# Patient Record
Sex: Female | Born: 1980 | Race: White | Hispanic: No | Marital: Single | Smoking: Current every day smoker
Health system: Southern US, Community
[De-identification: ages and names within clinical notes are randomized; demographics above are authoritative.]

## PROBLEM LIST (undated history)

## (undated) DIAGNOSIS — F329 Major depressive disorder, single episode, unspecified: Secondary | ICD-10-CM

## (undated) DIAGNOSIS — F32A Depression, unspecified: Secondary | ICD-10-CM

## (undated) DIAGNOSIS — N200 Calculus of kidney: Secondary | ICD-10-CM

## (undated) HISTORY — DX: Major depressive disorder, single episode, unspecified: F32.9

## (undated) HISTORY — DX: Depression, unspecified: F32.A

## (undated) HISTORY — DX: Calculus of kidney: N20.0

---

## 2005-08-07 ENCOUNTER — Ambulatory Visit: Payer: Self-pay

## 2008-02-24 ENCOUNTER — Emergency Department: Payer: Self-pay | Admitting: Internal Medicine

## 2008-04-06 ENCOUNTER — Emergency Department: Payer: Self-pay | Admitting: Emergency Medicine

## 2008-12-02 ENCOUNTER — Emergency Department: Payer: Self-pay

## 2008-12-22 ENCOUNTER — Emergency Department: Payer: Self-pay | Admitting: Emergency Medicine

## 2009-03-13 ENCOUNTER — Emergency Department: Payer: Self-pay | Admitting: Emergency Medicine

## 2009-03-23 ENCOUNTER — Emergency Department: Payer: Self-pay | Admitting: Emergency Medicine

## 2010-12-17 ENCOUNTER — Emergency Department: Payer: Self-pay | Admitting: Unknown Physician Specialty

## 2012-12-20 ENCOUNTER — Emergency Department: Payer: Self-pay | Admitting: Emergency Medicine

## 2013-03-12 ENCOUNTER — Emergency Department: Payer: Self-pay | Admitting: Emergency Medicine

## 2013-08-11 ENCOUNTER — Emergency Department: Payer: Self-pay | Admitting: Internal Medicine

## 2013-08-11 LAB — URINALYSIS, COMPLETE
Bilirubin,UR: NEGATIVE
Blood: NEGATIVE
Glucose,UR: NEGATIVE mg/dL (ref 0–75)
Nitrite: NEGATIVE
Ph: 5 (ref 4.5–8.0)
SPECIFIC GRAVITY: 1.031 (ref 1.003–1.030)
WBC UR: 39 /HPF (ref 0–5)

## 2013-12-07 ENCOUNTER — Emergency Department: Payer: Self-pay | Admitting: Emergency Medicine

## 2014-08-12 ENCOUNTER — Emergency Department
Admission: EM | Admit: 2014-08-12 | Discharge: 2014-08-12 | Disposition: A | Discharge status: Home / Self Care | Payer: BLUE CROSS/BLUE SHIELD | Attending: Emergency Medicine | Admitting: Emergency Medicine

## 2014-08-12 ENCOUNTER — Encounter: Payer: Self-pay | Admitting: Emergency Medicine

## 2014-08-12 DIAGNOSIS — Y998 Other external cause status: Secondary | ICD-10-CM | POA: Diagnosis not present

## 2014-08-12 DIAGNOSIS — Y9389 Activity, other specified: Secondary | ICD-10-CM | POA: Diagnosis not present

## 2014-08-12 DIAGNOSIS — Y9289 Other specified places as the place of occurrence of the external cause: Secondary | ICD-10-CM | POA: Diagnosis not present

## 2014-08-12 DIAGNOSIS — S41152A Open bite of left upper arm, initial encounter: Secondary | ICD-10-CM | POA: Insufficient documentation

## 2014-08-12 DIAGNOSIS — Z720 Tobacco use: Secondary | ICD-10-CM | POA: Insufficient documentation

## 2014-08-12 DIAGNOSIS — W540XXA Bitten by dog, initial encounter: Secondary | ICD-10-CM | POA: Insufficient documentation

## 2014-08-12 MED ORDER — AMOXICILLIN-POT CLAVULANATE 875-125 MG PO TABS: 1.0000 | ORAL_TABLET | Freq: Two times a day (BID) | ORAL | Status: DC

## 2014-08-12 MED ORDER — TETANUS-DIPHTH-ACELL PERTUSSIS 5-2.5-18.5 LF-MCG/0.5 IM SUSP: 0.5000 mL | Freq: Once | INTRAMUSCULAR | Status: DC

## 2014-08-12 MED ORDER — KETOROLAC TROMETHAMINE 10 MG PO TABS: 10.0000 mg | ORAL_TABLET | Freq: Three times a day (TID) | ORAL | Status: DC

## 2014-08-12 MED ORDER — TRAMADOL HCL 50 MG PO TABS: 50.0000 mg | ORAL_TABLET | Freq: Two times a day (BID) | ORAL | Status: DC

## 2014-08-12 NOTE — ED Provider Notes (Signed)
[facility] Emergency Department Provider Note ____________________________________________  Time seen: 39  I have reviewed the triage vital signs and the nursing notes.  HISTORY  Chief Complaint Animal Bite  HPI Margaret Gonzales is a 34 y.o. female who reports to the ED for evaluation and management of a injury sustained to her left arm, after her top of bit her today. She describes that her female pitbull was in heat, and got into an altercation in the bed with her other pitbull. She tried to break up the dog fight, and inadvertently got bitten on the left upper arm. She notes the female pit, that bit her has been in her care for the last year, given to her by her boyfriend.  She is unsure of that dog's vaccine status prior to her taking custody. She notes all of her dogs are healthy, well-kept, and indoor animals.  She notes the dog has not been aggressive, ill, or otherwise had a change in behavior. She initially had the wound cleaned and dressed by her friend who is a Public relations account executive. She reports here for evaluation management of her dog bite. She reports a tetanus status is up-to-date within 6 weeks due to a recent finger laceration.  History reviewed. No pertinent past medical history.  There are no active problems to display for this patient.  History reviewed. No pertinent past surgical history.  Current Outpatient Rx  Name  Route  Sig  Dispense  Refill  . amoxicillin-clavulanate (AUGMENTIN) 875-125 MG per tablet   Oral   Take 1 tablet by mouth 2 (two) times daily.   20 tablet   0   . ketorolac (TORADOL) 10 MG tablet   Oral   Take 1 tablet (10 mg total) by mouth every 8 (eight) hours.   15 tablet   0   . traMADol (ULTRAM) 50 MG tablet   Oral   Take 1 tablet (50 mg total) by mouth 2 (two) times daily.   10 tablet   0    Allergies Review of patient's allergies indicates no known allergies.  History reviewed. No pertinent family  history.  Social History History  Substance Use Topics  . Smoking status: Current Every Day Smoker -- 1.00 packs/day    Types: Cigarettes  . Smokeless tobacco: Not on file  . Alcohol Use: No   Review of Systems  Constitutional: Negative for fever. Eyes: Negative for visual changes. ENT: Negative for sore throat. Cardiovascular: Negative for chest pain. Respiratory: Negative for shortness of breath. Gastrointestinal: Negative for abdominal pain, vomiting and diarrhea. Genitourinary: Negative for dysuria. Musculoskeletal: Negative for back pain. Skin: Negative for rash. Dog bite to upper left arm. Neurological: Negative for headaches, focal weakness or numbness. ____________________________________________  PHYSICAL EXAM:  VITAL SIGNS: ED Triage Vitals  Enc Vitals Group     BP --      Pulse Rate 08/12/14 0826 83     Resp --      Temp 08/12/14 0826 98 F (36.7 C)     Temp Source 08/12/14 0826 Oral     SpO2 08/12/14 0826 98 %     Weight 08/12/14 0826 150 lb (68.04 kg)     Height 08/12/14 0826 5\' 7"  (1.702 m)     Head Cir --      Peak Flow --      Pain Score 08/12/14 0827 6     Pain Loc --      Pain Edu? --  Excl. in GC? --    Constitutional: Alert and oriented. Well appearing and in no distress. HEENT:   Normocephalic and atraumatic. Conjunctivae are normal. PERRL. Normal extraocular movements. Hematological/Lymphatic/Immunilogical: No cervical lymphadenopathy. Cardiovascular: Normal distal pulses. Respiratory: Normal respiratory effort. Musculoskeletal: Nontender with normal range of motion in all extremities. No lower extremity tenderness nor edema. Neurologic:  Normal speech and language. No gross focal neurologic deficits are appreciated. Skin:  Skin is warm, dry and intact, except for multiple lacerations. Bruising noted to the left biceps consistent with injuries. Left upper arm with a 1.5 cm superficial laceration exposing underlying fat over the biceps. Two  other 0.5 cm punctures noted distally.  A single linear scratch measuring about 4 cm in noted laterally.  Psychiatric: Mood and affect are normal. Patient exhibits appropriate insight and judgment. ____________________________________________  PROCEDURES  Wound clean and repaired with Steri-strips by this PA-C. ____________________________________________  INITIAL IMPRESSION / ASSESSMENT AND PLAN / ED COURSE Provoked dog bite from pet during agitated state when fighting with other dog. Patient reports dog that bite is likely from the smaller, unvaccinated female dog. Discussed rabies vaccine administration, but noted it was not emergently needed in this case. Return as discussed for rabies series, if needed, in 10-12 days. Will follow-up with veterinarian to update rabies vaccine and discuss quarantine process. Patient advised on the low likelihood of rabies in healthy, indoor, personal pet.  ____________________________________________  FINAL CLINICAL IMPRESSION(S) / ED DIAGNOSES  Final diagnoses:  Dog bite of arm, left, initial encounter     Lissa Hoard, PA-C 08/12/14 9604  Sharyn Creamer, MD 08/12/14 1551

## 2014-08-12 NOTE — ED Notes (Signed)
Pt has dog bite-pit bull to left upper arm, pt states a fireman friend cleaned it, arm wrapped in gauze upon arrival. The dog belonged to pt, states she thinks all of the shots were up to date. Bleeding controlled at this time.

## 2014-08-12 NOTE — Discharge Instructions (Signed)
Laceration Care, Adult A laceration is a cut that goes through all layers of the skin. The cut goes into the tissue beneath the skin. HOME CARE For stitches (sutures) or staples:  Keep the cut clean and dry.  If you have a bandage (dressing), change it at least once a day. Change the bandage if it gets wet or dirty, or as told by your doctor.  Wash the cut with soap and water 2 times a day. Rinse the cut with water. Pat it dry with a clean towel.  Put a thin layer of medicated cream on the cut as told by your doctor.  You may shower after the first 24 hours. Do not soak the cut in water until the stitches are removed.  Only take medicines as told by your doctor.  Have your stitches or staples removed as told by your doctor. For skin adhesive strips:  Keep the cut clean and dry.  Do not get the strips wet. You may take a bath, but be careful to keep the cut dry.  If the cut gets wet, pat it dry with a clean towel.  The strips will fall off on their own. Do not remove the strips that are still stuck to the cut. For wound glue:  You may shower or take baths. Do not soak or scrub the cut. Do not swim. Avoid heavy sweating until the glue falls off on its own. After a shower or bath, pat the cut dry with a clean towel.  Do not put medicine on your cut until the glue falls off.  If you have a bandage, do not put tape over the glue.  Avoid lots of sunlight or tanning lamps until the glue falls off. Put sunscreen on the cut for the first year to reduce your scar.  The glue will fall off on its own. Do not pick at the glue. You may need a tetanus shot if:  You cannot remember when you had your last tetanus shot.  You have never had a tetanus shot. If you need a tetanus shot and you choose not to have one, you may get tetanus. Sickness from tetanus can be serious. GET HELP RIGHT AWAY IF:   Your pain does not get better with medicine.  Your arm, hand, leg, or foot loses feeling  (numbness) or changes color.  Your cut is bleeding.  Your joint feels weak, or you cannot use your joint.  You have painful lumps on your body.  Your cut is red, puffy (swollen), or painful.  You have a red line on the skin near the cut.  You have yellowish-white fluid (pus) coming from the cut.  You have a fever.  You have a bad smell coming from the cut or bandage.  Your cut breaks open before or after stitches are removed.  You notice something coming out of the cut, such as wood or glass.  You cannot move a finger or toe. MAKE SURE YOU:   Understand these instructions.  Will watch your condition.  Will get help right away if you are not doing well or get worse. Document Released: 08/01/2007 Document Revised: 05/07/2011 Document Reviewed: 08/08/2010 [facility] Patient Information 2015 [place], [place]. This information is not intended to replace advice given to you by your health care provider. Make sure you discuss any questions you have with your health care provider.  Animal Bite Animal bite wounds can get infected. It is important to get proper medical treatment. Ask your  doctor if you need a rabies shot. HOME CARE   Follow your doctor's instructions for taking care of your wound.  Only take medicine as told by your doctor.  Take your medicine (antibiotics) as told. Finish them even if you start to feel better.  Keep all doctor visits as told. You may need a tetanus shot if:   You cannot remember when you had your last tetanus shot.  You have never had a tetanus shot.  The injury broke your skin. If you need a tetanus shot and you choose not to have one, you may get tetanus. Sickness from tetanus can be serious. GET HELP RIGHT AWAY IF:   Your wound is warm, red, sore, or puffy (swollen).  You notice yellowish-white fluid (pus) or a bad smell coming from the wound.  You see a red line on the skin coming from the wound.  You have a fever, chills, or you  feel sick.  You feel sick to your stomach (nauseous), or you throw up (vomit).  Your pain does not go away, or it gets worse.  You have trouble moving the injured part.  You have questions or concerns. MAKE SURE YOU:   Understand these instructions.  Will watch your condition.  Will get help right away if you are not doing well or get worse. Document Released: 02/12/2005 Document Revised: 05/07/2011 Document Reviewed: 10/04/2010 [facility] Patient Information 2015 [place], [place]. This information is not intended to replace advice given to you by your health care provider. Make sure you discuss any questions you have with your health care provider.  Keep the wound clean, dry, and covered.  Return as needed for wound checks.  Take the antibiotic as directed for infection control.  Apply ice to reduce swelling.

## 2014-08-12 NOTE — ED Notes (Signed)
Steri strips applied to left arm

## 2015-02-15 ENCOUNTER — Ambulatory Visit (INDEPENDENT_AMBULATORY_CARE_PROVIDER_SITE_OTHER): Payer: BLUE CROSS/BLUE SHIELD | Admitting: Family Medicine

## 2015-02-15 ENCOUNTER — Encounter: Payer: Self-pay | Admitting: Family Medicine

## 2015-02-15 VITALS — BP 103/67 | HR 80 | Temp 98.1°F | Resp 16 | Ht 67.0 in | Wt 156.0 lb

## 2015-02-15 DIAGNOSIS — M542 Cervicalgia: Secondary | ICD-10-CM

## 2015-02-15 DIAGNOSIS — Z7189 Other specified counseling: Secondary | ICD-10-CM

## 2015-02-15 DIAGNOSIS — Z720 Tobacco use: Secondary | ICD-10-CM

## 2015-02-15 DIAGNOSIS — F17200 Nicotine dependence, unspecified, uncomplicated: Secondary | ICD-10-CM | POA: Insufficient documentation

## 2015-02-15 DIAGNOSIS — G43909 Migraine, unspecified, not intractable, without status migrainosus: Secondary | ICD-10-CM

## 2015-02-15 DIAGNOSIS — Z7689 Persons encountering health services in other specified circumstances: Secondary | ICD-10-CM | POA: Insufficient documentation

## 2015-02-15 MED ORDER — MELOXICAM 15 MG PO TABS: 15.0000 mg | ORAL_TABLET | Freq: Every day | ORAL | Status: DC

## 2015-02-15 MED ORDER — METAXALONE 800 MG PO TABS: 800.0000 mg | ORAL_TABLET | Freq: Three times a day (TID) | ORAL | Status: DC

## 2015-02-15 NOTE — Patient Instructions (Signed)
Keep headache diary  Discussed stopping smoking

## 2015-02-15 NOTE — Progress Notes (Signed)
Name: Margaret Gonzales   MRN: [id number]    DOB: 11/01/1980   Date:02/15/2015       Progress Note  Subjective  Chief Complaint  Chief Complaint  Patient presents with  . Establish Care    HPI Here to establish care.  She is a smoker.  Smokes 1.5 ppd.  She would like to stop.    She has migraine headaches fairly frequently. She takes multiple medications.  She has never kept a headache diary.  HAs are associated with nausea, photo and phono phobia.  HA is usually throbbing and R frontal.  No aura.  Has R neck and arm pain down to 4th and 5th fingers.  Daily.  No problem-specific assessment & plan notes found for this encounter.   Past Medical History  Diagnosis Date  . Depression   . Kidney stones     History reviewed. No pertinent past surgical history.  Family History  Problem Relation Age of Onset  . Diabetes Mother     Social History   Social History  . Marital Status: Single    Spouse Name: N/A  . Number of Children: N/A  . Years of Education: N/A   Occupational History  . Not on file.   Social History Main Topics  . Smoking status: Current Every Day Smoker -- 1.50 packs/day    Types: Cigarettes  . Smokeless tobacco: Never Used  . Alcohol Use: No  . Drug Use: Yes    Special: Marijuana  . Sexual Activity: Not on file   Other Topics Concern  . Not on file   Social History Narrative     Current outpatient prescriptions:  .  amoxicillin-clavulanate (AUGMENTIN) 875-125 MG tablet, , Disp: , Rfl: 0 .  methylPREDNISolone (MEDROL DOSEPAK) 4 MG TBPK tablet, , Disp: , Rfl: 0 .  meloxicam (MOBIC) 15 MG tablet, Take 1 tablet (15 mg total) by mouth daily., Disp: 30 tablet, Rfl: 3 .  metaxalone (SKELAXIN) 800 MG tablet, Take 1 tablet (800 mg total) by mouth 3 (three) times daily., Disp: 30 tablet, Rfl: 3  Not on File   Review of Systems  Constitutional: Negative for fever, chills, weight loss and malaise/fatigue.  HENT: Negative for hearing loss.   Eyes:  Negative for blurred vision and double vision.  Respiratory: Positive for shortness of breath (in cold air). Negative for cough and wheezing.   Cardiovascular: Negative for chest pain, palpitations and leg swelling.  Gastrointestinal: Negative for heartburn, abdominal pain and constipation.  Genitourinary: Negative for dysuria, urgency and frequency.  Musculoskeletal: Positive for neck pain (R neck and arm and into R hand).  Skin: Negative for rash.  Neurological: Negative for weakness and headaches.      Objective  Filed Vitals:   02/15/15 1044  BP: 103/67  Pulse: 80  Temp: 98.1 F (36.7 C)  Resp: 16  Height: 5\' 7"  (1.702 m)  Weight: 156 lb (70.761 kg)  SpO2: 98%    Physical Exam  Constitutional: She is oriented to person, place, and time and well-developed, well-nourished, and in no distress. No distress.  HENT:  Head: Normocephalic and atraumatic.  Right Ear: External ear normal.  Left Ear: External ear normal.  Nose: Nose normal.  Mouth/Throat: Oropharynx is clear and moist.  Eyes: Conjunctivae and EOM are normal. Pupils are equal, round, and reactive to light. No scleral icterus.  Neck: Normal range of motion. Neck supple. Carotid bruit is not present. No thyromegaly present.  Cardiovascular: Normal rate, regular rhythm and  normal heart sounds.  Exam reveals no gallop and no friction rub.   No murmur heard. Pulmonary/Chest: Effort normal and breath sounds normal. No respiratory distress. She has no wheezes. She has no rales.  Abdominal: Soft. Bowel sounds are normal. She exhibits no distension and no mass. There is no tenderness.  Musculoskeletal: She exhibits no edema.  Lymphadenopathy:    She has no cervical adenopathy.  Neurological: She is alert and oriented to person, place, and time.  Pain from R neck trapezius into R shoulder and down R arn to R 4th and 5th fingers.  Vitals reviewed.      No results found for this or any previous visit (from the past  2160 hour(s)).   Assessment & Plan  Problem List Items Addressed This Visit      Cardiovascular and Mediastinum   Migraine (Chronic)   Relevant Medications   meloxicam (MOBIC) 15 MG tablet   metaxalone (SKELAXIN) 800 MG tablet     Other   Encounter to establish care - Primary   Smoker   Neck pain on right side   Relevant Medications   meloxicam (MOBIC) 15 MG tablet   metaxalone (SKELAXIN) 800 MG tablet      Meds ordered this encounter  Medications  . DISCONTD: AFLURIA PRESERVATIVE FREE 0.5 ML SUSY    Sig: inject 0.5 milliliter intramuscularly    Refill:  0  . methylPREDNISolone (MEDROL DOSEPAK) 4 MG TBPK tablet    Sig:     Refill:  0  . amoxicillin-clavulanate (AUGMENTIN) 875-125 MG tablet    Sig:     Refill:  0  . meloxicam (MOBIC) 15 MG tablet    Sig: Take 1 tablet (15 mg total) by mouth daily.    Dispense:  30 tablet    Refill:  3  . metaxalone (SKELAXIN) 800 MG tablet    Sig: Take 1 tablet (800 mg total) by mouth 3 (three) times daily.    Dispense:  30 tablet    Refill:  3   1. Encounter to establish care   2. Migraine without status migrainosus, not intractable, unspecified migraine type -keep headache diary  3. Smoker -discussed stopping smoking  4. Neck pain on right side  - meloxicam (MOBIC) 15 MG tablet; Take 1 tablet (15 mg total) by mouth daily.  Dispense: 30 tablet; Refill: 3 - metaxalone (SKELAXIN) 800 MG tablet; Take 1 tablet (800 mg total) by mouth 3 (three) times daily.  Dispense: 30 tablet; Refill: 3

## 2015-03-29 ENCOUNTER — Encounter: Payer: Self-pay | Admitting: Family Medicine

## 2015-03-29 ENCOUNTER — Ambulatory Visit (INDEPENDENT_AMBULATORY_CARE_PROVIDER_SITE_OTHER): Payer: BLUE CROSS/BLUE SHIELD | Admitting: Family Medicine

## 2015-03-29 VITALS — BP 119/72 | HR 68 | Temp 98.0°F | Resp 16 | Ht 67.0 in | Wt 148.0 lb

## 2015-03-29 DIAGNOSIS — F329 Major depressive disorder, single episode, unspecified: Secondary | ICD-10-CM | POA: Diagnosis not present

## 2015-03-29 DIAGNOSIS — F419 Anxiety disorder, unspecified: Secondary | ICD-10-CM | POA: Insufficient documentation

## 2015-03-29 DIAGNOSIS — F411 Generalized anxiety disorder: Secondary | ICD-10-CM | POA: Diagnosis not present

## 2015-03-29 DIAGNOSIS — G43909 Migraine, unspecified, not intractable, without status migrainosus: Secondary | ICD-10-CM | POA: Diagnosis not present

## 2015-03-29 DIAGNOSIS — Z720 Tobacco use: Secondary | ICD-10-CM | POA: Diagnosis not present

## 2015-03-29 DIAGNOSIS — F17200 Nicotine dependence, unspecified, uncomplicated: Secondary | ICD-10-CM

## 2015-03-29 DIAGNOSIS — F32A Depression, unspecified: Secondary | ICD-10-CM | POA: Insufficient documentation

## 2015-03-29 MED ORDER — ESCITALOPRAM OXALATE 10 MG PO TABS: 10.0000 mg | ORAL_TABLET | Freq: Every day | ORAL | Status: DC

## 2015-03-29 NOTE — Progress Notes (Signed)
Name: Margaret Gonzales   MRN: [id number]    DOB: April 12, 1980   Date:03/29/2015       Progress Note  Subjective  Chief Complaint  Chief Complaint  Patient presents with  . Migraine    HPI Here for f/u of Migraine HAs.  Her HAs have improved greatly since she got glasses.  Also doing well with Mobic, but wonders if she needs it regularly.  C/o lack of motivation, poor sleep (awakening), poor energy, appetite is poor  Drinks a lot of coffee.  Feels anxious all the time.  Worries about lots of things that are not important.  Cries easily.  Father had bipolar disorder.  She has never been on meds for depression.   No problem-specific assessment & plan notes found for this encounter.   Past Medical History  Diagnosis Date  . Depression   . Kidney stones     History reviewed. No pertinent past surgical history.  Family History  Problem Relation Age of Onset  . Diabetes Mother     Social History   Social History  . Marital Status: Single    Spouse Name: N/A  . Number of Children: N/A  . Years of Education: N/A   Occupational History  . Not on file.   Social History Main Topics  . Smoking status: Current Every Day Smoker -- 1.50 packs/day    Types: Cigarettes  . Smokeless tobacco: Never Used  . Alcohol Use: No  . Drug Use: Yes    Special: Marijuana  . Sexual Activity: Not on file   Other Topics Concern  . Not on file   Social History Narrative     Current outpatient prescriptions:  .  meloxicam (MOBIC) 15 MG tablet, Take 1 tablet (15 mg total) by mouth daily., Disp: 30 tablet, Rfl: 3 .  metaxalone (SKELAXIN) 800 MG tablet, Take 1 tablet (800 mg total) by mouth 3 (three) times daily., Disp: 30 tablet, Rfl: 3 .  escitalopram (LEXAPRO) 10 MG tablet, Take 1 tablet (10 mg total) by mouth daily., Disp: 30 tablet, Rfl: 3  Not on File   Review of Systems  Constitutional: Negative for fever, chills, weight loss and malaise/fatigue.  HENT: Negative for hearing loss.    Eyes: Negative for blurred vision and double vision.  Respiratory: Negative for cough, shortness of breath and wheezing.   Cardiovascular: Negative for chest pain, palpitations and leg swelling.  Gastrointestinal: Negative for heartburn, abdominal pain and blood in stool.  Genitourinary: Negative for dysuria, urgency and frequency.  Musculoskeletal: Negative for myalgias.  Skin: Negative for rash.  Neurological: Positive for headaches. Negative for dizziness, tremors and weakness.  Psychiatric/Behavioral: Positive for depression. The patient is nervous/anxious and has insomnia.       Objective  Filed Vitals:   03/29/15 1034  BP: 119/72  Pulse: 68  Temp: 98 F (36.7 C)  TempSrc: Oral  Resp: 16  Height:  (1.702 m)  Weight: 148 lb (67.132 kg)    Physical Exam  Constitutional: She is oriented to person, place, and time and well-developed, well-nourished, and in no distress. No distress.  HENT:  Head: Normocephalic and atraumatic.  Eyes: Conjunctivae and EOM are normal. Pupils are equal, round, and reactive to light.  Neck: Normal range of motion. Neck supple. Carotid bruit is not present. No thyromegaly present.  Cardiovascular: Normal rate, regular rhythm and normal heart sounds.  Exam reveals no gallop and no friction rub.   No murmur heard. Pulmonary/Chest: Effort normal and  breath sounds normal. No respiratory distress. She has no wheezes. She has no rales.  Abdominal: Soft. Bowel sounds are normal. She exhibits no distension, no abdominal bruit and no mass. There is no tenderness.  Musculoskeletal: She exhibits no edema.  Lymphadenopathy:    She has no cervical adenopathy.  Neurological: She is alert and oriented to person, place, and time.  Psychiatric:  Affec is anxious and depressed.  PHQ-9 score of 17.  GAD 7 score of 16  Vitals reviewed.      No results found for this or any previous visit (from the past 2160 hour(s)).   Assessment & Plan  Problem  List Items Addressed This Visit      Cardiovascular and Mediastinum   Migraine - Primary (Chronic)   Relevant Medications   escitalopram (LEXAPRO) 10 MG tablet     Other   Smoker   Relevant Orders   Nurse to provide smoking / tobacco cessation education   Depression   Relevant Medications   escitalopram (LEXAPRO) 10 MG tablet   Other Relevant Orders   Comprehensive Metabolic Panel (CMET)   CBC with Differential   TSH   Anxiety disorder   Relevant Medications   escitalopram (LEXAPRO) 10 MG tablet      Meds ordered this encounter  Medications  . escitalopram (LEXAPRO) 10 MG tablet    Sig: Take 1 tablet (10 mg total) by mouth daily.    Dispense:  30 tablet    Refill:  3    1. Depression  - Comprehensive Metabolic Panel (CMET) - CBC with Differential - TSH - escitalopram (LEXAPRO) 10 MG tablet; Take 1 tablet (10 mg total) by mouth daily.  Dispense: 30 tablet; Refill: 3  2. Generalized anxiety disorder  - escitalopram (LEXAPRO) 10 MG tablet; Take 1 tablet (10 mg total) by mouth daily.  Dispense: 30 tablet; Refill: 3  3. Migraine without status migrainosus, not intractable, unspecified migraine type   4. Smoker  - Nurse to provide smoking / tobacco cessation education

## 2015-04-05 LAB — CBC WITH DIFFERENTIAL/PLATELET
BASOS ABS: 0.1 10*3/uL (ref 0.0–0.2)
Basos: 0 %
EOS (ABSOLUTE): 0.4 10*3/uL (ref 0.0–0.4)
EOS: 3 %
HEMATOCRIT: 42.2 % (ref 34.0–46.6)
Hemoglobin: 14.2 g/dL (ref 11.1–15.9)
IMMATURE GRANULOCYTES: 0 %
Immature Grans (Abs): 0 10*3/uL (ref 0.0–0.1)
Lymphocytes Absolute: 2.4 10*3/uL (ref 0.7–3.1)
Lymphs: 18 %
MCH: 29.7 pg (ref 26.6–33.0)
MCHC: 33.6 g/dL (ref 31.5–35.7)
MCV: 88 fL (ref 79–97)
MONOCYTES: 8 %
MONOS ABS: 1 10*3/uL — AB (ref 0.1–0.9)
NEUTROS PCT: 71 %
Neutrophils Absolute: 9.2 10*3/uL — ABNORMAL HIGH (ref 1.4–7.0)
Platelets: 347 10*3/uL (ref 150–379)
RBC: 4.78 x10E6/uL (ref 3.77–5.28)
RDW: 13.4 % (ref 12.3–15.4)
WBC: 13.1 10*3/uL — AB (ref 3.4–10.8)

## 2015-04-05 LAB — COMPREHENSIVE METABOLIC PANEL
ALK PHOS: 60 IU/L (ref 39–117)
ALT: 10 IU/L (ref 0–32)
AST: 14 IU/L (ref 0–40)
Albumin/Globulin Ratio: 1.9 (ref 1.1–2.5)
Albumin: 4.4 g/dL (ref 3.5–5.5)
BUN/Creatinine Ratio: 25 — ABNORMAL HIGH (ref 8–20)
BUN: 15 mg/dL (ref 6–20)
Bilirubin Total: <0.2 mg/dL (ref 0.0–1.2)
CHLORIDE: 104 mmol/L (ref 96–106)
CO2: 19 mmol/L (ref 18–29)
CREATININE: 0.59 mg/dL (ref 0.57–1.00)
Calcium: 9.5 mg/dL (ref 8.7–10.2)
GFR calc Af Amer: 138 mL/min/{1.73_m2} (ref >59)
GFR calc non Af Amer: 120 mL/min/{1.73_m2} (ref >59)
GLUCOSE: 95 mg/dL (ref 65–99)
Globulin, Total: 2.3 g/dL (ref 1.5–4.5)
Potassium: 4.3 mmol/L (ref 3.5–5.2)
SODIUM: 140 mmol/L (ref 134–144)
Total Protein: 6.7 g/dL (ref 6.0–8.5)

## 2015-04-05 LAB — TSH: TSH: 1.51 u[IU]/mL (ref 0.450–4.500)

## 2015-05-09 ENCOUNTER — Ambulatory Visit (INDEPENDENT_AMBULATORY_CARE_PROVIDER_SITE_OTHER): Payer: BLUE CROSS/BLUE SHIELD | Admitting: Family Medicine

## 2015-05-09 ENCOUNTER — Encounter: Payer: Self-pay | Admitting: Family Medicine

## 2015-05-09 VITALS — BP 113/60 | HR 70 | Resp 16 | Ht 67.0 in | Wt 152.4 lb

## 2015-05-09 DIAGNOSIS — F329 Major depressive disorder, single episode, unspecified: Secondary | ICD-10-CM

## 2015-05-09 DIAGNOSIS — G43009 Migraine without aura, not intractable, without status migrainosus: Secondary | ICD-10-CM | POA: Diagnosis not present

## 2015-05-09 DIAGNOSIS — F411 Generalized anxiety disorder: Secondary | ICD-10-CM

## 2015-05-09 DIAGNOSIS — F410 Panic disorder [episodic paroxysmal anxiety] without agoraphobia: Secondary | ICD-10-CM

## 2015-05-09 DIAGNOSIS — F32A Depression, unspecified: Secondary | ICD-10-CM

## 2015-05-09 MED ORDER — ESCITALOPRAM OXALATE 20 MG PO TABS: 20.0000 mg | ORAL_TABLET | Freq: Every day | ORAL | Status: DC

## 2015-05-09 NOTE — Progress Notes (Signed)
Name: Margaret Gonzales   MRN: [id number]    DOB: 12/04/1980   Date:05/09/2015       Progress Note  Subjective  Chief Complaint  Chief Complaint  Patient presents with  . Depression    meds making her less social and she wants to sleep all day and be up all night, Taking med at 5:30 in morning. Patient has diarrhea and constipation now and then since starting meds.   . Headache    had to go to ER and get Toradol shot last week and had headache for 7 days.     HPI  Here for f/u of Depression.  She feels some better, but with poor motivation.  Much less overall depressed.  Appetite is +/-.  Drinks a lot of coffee.    Has HAs that have been on and off for a long time.  She has them often.  To ER last week for HA. No problem-specific assessment & plan notes found for this encounter.   Past Medical History  Diagnosis Date  . Depression   . Kidney stones     History reviewed. No pertinent past surgical history.  Family History  Problem Relation Age of Onset  . Diabetes Mother     Social History   Social History  . Marital Status: Single    Spouse Name: N/A  . Number of Children: N/A  . Years of Education: N/A   Occupational History  . Not on file.   Social History Main Topics  . Smoking status: Current Every Day Smoker -- 1.50 packs/day    Types: Cigarettes  . Smokeless tobacco: Never Used  . Alcohol Use: No  . Drug Use: Yes    Special: Marijuana  . Sexual Activity: Not on file   Other Topics Concern  . Not on file   Social History Narrative     Current outpatient prescriptions:  .  escitalopram (LEXAPRO) 20 MG tablet, Take 1 tablet (20 mg total) by mouth daily., Disp: 30 tablet, Rfl: 4  Not on File   Review of Systems  Constitutional: Negative for fever, chills, weight loss and malaise/fatigue.  HENT: Negative for congestion, ear pain and hearing loss.   Eyes: Negative for blurred vision and double vision.  Respiratory: Negative for cough, shortness of  breath and wheezing.   Cardiovascular: Negative for chest pain, palpitations and leg swelling.  Gastrointestinal: Positive for diarrhea. Negative for heartburn, abdominal pain and blood in stool.  Genitourinary: Negative for dysuria, urgency and frequency.  Musculoskeletal: Negative for myalgias and joint pain.  Skin: Negative for rash.  Neurological: Positive for headaches. Negative for weakness.      Objective  Filed Vitals:   05/09/15 1608  BP: 113/60  Pulse: 70  Resp: 16  Height: 5\' 7"  (1.702 m)  Weight: 152 lb 6.4 oz (69.128 kg)  SpO2: 100%    Physical Exam  Constitutional: She is oriented to person, place, and time and well-developed, well-nourished, and in no distress. No distress.  HENT:  Head: Normocephalic and atraumatic.  Eyes: Conjunctivae are normal. Pupils are equal, round, and reactive to light. No scleral icterus.  Neck: Normal range of motion. Neck supple. Carotid bruit is not present. No thyromegaly present.  Cardiovascular: Normal rate, regular rhythm and normal heart sounds.  Exam reveals no gallop and no friction rub.   No murmur heard. Pulmonary/Chest: Effort normal and breath sounds normal. No respiratory distress. She has no wheezes. She has no rales.  Abdominal: Soft. Bowel  sounds are normal. She exhibits no distension and no mass. There is no tenderness.  Musculoskeletal: She exhibits no edema.  Lymphadenopathy:    She has no cervical adenopathy.  Neurological: She is alert and oriented to person, place, and time.  Vitals reviewed.      Recent Results (from the past 2160 hour(s))  Comprehensive Metabolic Panel (CMET)     Status: Abnormal   Collection Time: 04/04/15  8:57 AM  Result Value Ref Range   Glucose 95 65 - 99 mg/dL   BUN 15 6 - 20 mg/dL   Creatinine, Ser 4.74 0.57 - 1.00 mg/dL   GFR calc non Af Amer 120 >59 mL/min/1.73   GFR calc Af Amer 138 >59 mL/min/1.73   BUN/Creatinine Ratio 25 (H) 8 - 20   Sodium 140 134 - 144 mmol/L    Potassium 4.3 3.5 - 5.2 mmol/L   Chloride 104 96 - 106 mmol/L   CO2 19 18 - 29 mmol/L   Calcium 9.5 8.7 - 10.2 mg/dL   Total Protein 6.7 6.0 - 8.5 g/dL   Albumin 4.4 3.5 - 5.5 g/dL   Globulin, Total 2.3 1.5 - 4.5 g/dL   Albumin/Globulin Ratio 1.9 1.1 - 2.5   Bilirubin Total <0.2 0.0 - 1.2 mg/dL   Alkaline Phosphatase 60 39 - 117 IU/L   AST 14 0 - 40 IU/L   ALT 10 0 - 32 IU/L  CBC with Differential     Status: Abnormal   Collection Time: 04/04/15  8:57 AM  Result Value Ref Range   WBC 13.1 (H) 3.4 - 10.8 x10E3/uL   RBC 4.78 3.77 - 5.28 x10E6/uL   Hemoglobin 14.2 11.1 - 15.9 g/dL   Hematocrit 25.9 56.3 - 46.6 %   MCV 88 79 - 97 fL   MCH 29.7 26.6 - 33.0 pg   MCHC 33.6 31.5 - 35.7 g/dL   RDW 87.5 64.3 - 32.9 %   Platelets 347 150 - 379 x10E3/uL   Neutrophils 71 %   Lymphs 18 %   Monocytes 8 %   Eos 3 %   Basos 0 %   Neutrophils Absolute 9.2 (H) 1.4 - 7.0 x10E3/uL   Lymphocytes Absolute 2.4 0.7 - 3.1 x10E3/uL   Monocytes Absolute 1.0 (H) 0.1 - 0.9 x10E3/uL   EOS (ABSOLUTE) 0.4 0.0 - 0.4 x10E3/uL   Basophils Absolute 0.1 0.0 - 0.2 x10E3/uL   Immature Granulocytes 0 %   Immature Grans (Abs) 0.0 0.0 - 0.1 x10E3/uL  TSH     Status: None   Collection Time: 04/04/15  8:57 AM  Result Value Ref Range   TSH 1.510 0.450 - 4.500 uIU/mL     Assessment & Plan  Problem List Items Addressed This Visit      Cardiovascular and Mediastinum   Migraine (Chronic)   Relevant Medications   escitalopram (LEXAPRO) 20 MG tablet   Other Relevant Orders   Ambulatory referral to Neurology     Other   Depression - Primary   Relevant Medications   escitalopram (LEXAPRO) 20 MG tablet   Anxiety disorder   Relevant Medications   escitalopram (LEXAPRO) 20 MG tablet      Meds ordered this encounter  Medications  . escitalopram (LEXAPRO) 20 MG tablet    Sig: Take 1 tablet (20 mg total) by mouth daily.    Dispense:  30 tablet    Refill:  4   1. Depression  - escitalopram (LEXAPRO) 20  MG tablet; Take 1 tablet (20  mg total) by mouth daily.  Dispense: 30 tablet; Refill: 4  2. Panic disorder without agoraphobia   3. Nonintractable migraine, unspecified migraine type  - Ambulatory referral to Neurology  4. Generalized anxiety disorder  - escitalopram (LEXAPRO) 20 MG tablet; Take 1 tablet (20 mg total) by mouth daily.  Dispense: 30 tablet; Refill: 4

## 2015-05-27 ENCOUNTER — Telehealth: Payer: Self-pay | Admitting: *Deleted

## 2015-05-27 NOTE — Telephone Encounter (Signed)
Called patient to see if she has been [facility] [email or web address]

## 2015-06-02 ENCOUNTER — Telehealth: Payer: Self-pay | Admitting: *Deleted

## 2015-06-02 NOTE — Telephone Encounter (Signed)
Patient says she was told by [facility] she could not take her Lexapro for 3 days prior to allergy testing. Patient says that you told her she couldn't just d/c medication. Please advise.

## 2015-06-02 NOTE — Telephone Encounter (Signed)
When is she supposed to have the testing done.  If it is right away, just stop it and be prepared to feel a little bad until she restarts in the day of the testing.  If it is down the road, take 1/2 tablet daily for several days (4) before stopping it for 3 days.-jh

## 2015-06-03 NOTE — Telephone Encounter (Signed)
Patient aware. Testing has not been scheduled yet so she has time to taper down to 1/2 4 days prior.

## 2015-06-06 NOTE — Telephone Encounter (Signed)
Noted-jh 

## 2015-06-13 ENCOUNTER — Ambulatory Visit: Payer: BLUE CROSS/BLUE SHIELD | Admitting: Family Medicine

## 2015-12-12 ENCOUNTER — Encounter: Payer: Self-pay | Admitting: Emergency Medicine

## 2015-12-12 ENCOUNTER — Emergency Department
Admission: EM | Admit: 2015-12-12 | Discharge: 2015-12-12 | Disposition: A | Discharge status: Home / Self Care | Payer: BLUE CROSS/BLUE SHIELD | Attending: Emergency Medicine | Admitting: Emergency Medicine

## 2015-12-12 ENCOUNTER — Emergency Department: Payer: BLUE CROSS/BLUE SHIELD

## 2015-12-12 DIAGNOSIS — F17210 Nicotine dependence, cigarettes, uncomplicated: Secondary | ICD-10-CM | POA: Diagnosis not present

## 2015-12-12 DIAGNOSIS — R0789 Other chest pain: Secondary | ICD-10-CM | POA: Diagnosis not present

## 2015-12-12 DIAGNOSIS — R0602 Shortness of breath: Secondary | ICD-10-CM | POA: Insufficient documentation

## 2015-12-12 DIAGNOSIS — R079 Chest pain, unspecified: Secondary | ICD-10-CM | POA: Diagnosis present

## 2015-12-12 LAB — CBC
HEMATOCRIT: 44.6 % (ref 35.0–47.0)
HEMOGLOBIN: 15.5 g/dL (ref 12.0–16.0)
MCH: 30.6 pg (ref 26.0–34.0)
MCHC: 34.7 g/dL (ref 32.0–36.0)
MCV: 88.1 fL (ref 80.0–100.0)
Platelets: 316 10*3/uL (ref 150–440)
RBC: 5.07 MIL/uL (ref 3.80–5.20)
RDW: 13.5 % (ref 11.5–14.5)
WBC: 15.5 10*3/uL — AB (ref 3.6–11.0)

## 2015-12-12 LAB — BASIC METABOLIC PANEL
ANION GAP: 9 (ref 5–15)
BUN: 14 mg/dL (ref 6–20)
CHLORIDE: 107 mmol/L (ref 101–111)
CO2: 22 mmol/L (ref 22–32)
Calcium: 9 mg/dL (ref 8.9–10.3)
Creatinine, Ser: 0.69 mg/dL (ref 0.44–1.00)
GFR calc non Af Amer: >60 mL/min (ref >60)
Glucose, Bld: 102 mg/dL — ABNORMAL HIGH (ref 65–99)
POTASSIUM: 3.8 mmol/L (ref 3.5–5.1)
Sodium: 138 mmol/L (ref 135–145)

## 2015-12-12 LAB — TROPONIN I: Troponin I: <0.03 ng/mL (ref <0.03)

## 2015-12-12 MED ORDER — RANITIDINE HCL 150 MG PO TABS: 150.0000 mg | ORAL_TABLET | Freq: Two times a day (BID) | ORAL | 1 refills | Status: DC

## 2015-12-12 MED ORDER — SUCRALFATE 1 G PO TABS: 1.0000 g | ORAL_TABLET | Freq: Four times a day (QID) | ORAL | 0 refills | Status: DC

## 2015-12-12 NOTE — ED Triage Notes (Signed)
Pt presents with chest pain x 3 weeks. States she has some sob "but I smoke like a freight train, so I brush that off." Pt states she has pain in her right arm as well. Pt denies n/v/d. Pt alert & oriented with NAD noted.

## 2015-12-12 NOTE — ED Notes (Signed)
AAOx3.  Skin warm and dry. No SOB/ DOE.  D/C home. 

## 2015-12-12 NOTE — Discharge Instructions (Signed)
Please seek medical attention for any high fevers, chest pain, shortness of breath, change in behavior, persistent vomiting, bloody stool or any other new or concerning symptoms.  

## 2015-12-12 NOTE — ED Notes (Signed)
Patient denies pain and is resting comfortably.  

## 2015-12-12 NOTE — ED Provider Notes (Signed)
[facility] Emergency Department Provider Note    ____________________________________________   I have reviewed the triage vital signs and the nursing notes.   HISTORY  Chief Complaint Chest Pain   History limited by: Not Limited   HPI Margaret Gonzales is a 35 y.o. female who presents to the emergency department today because of concern for chest pain. It is located on the right lower chest and central chest. It started a few weeks ago. It is intermittent. She describes it as sharp. It is not positional. It does not change based on whether or not she eats. Has not had any vomiting or diarrhea. States she does take aspirin. No fevers.   Past Medical History:  Diagnosis Date  . Depression   . Kidney stones     Patient Active Problem List   Diagnosis Date Noted  . Depression 03/29/2015  . Anxiety disorder 03/29/2015  . Migraine 02/15/2015  . Encounter to establish care 02/15/2015  . Smoker 02/15/2015  . Neck pain on right side 02/15/2015    History reviewed. No pertinent surgical history.  Prior to Admission medications   Medication Sig Start Date End Date Taking? Authorizing Provider  escitalopram (LEXAPRO) 20 MG tablet Take 1 tablet (20 mg total) by mouth daily. 05/09/15   Janeann Forehand., MD    Allergies Lexapro [escitalopram oxalate]  Family History  Problem Relation Age of Onset  . Diabetes Mother     Social History Social History  Substance Use Topics  . Smoking status: Current Every Day Smoker    Packs/day: 1.50    Types: Cigarettes  . Smokeless tobacco: Never Used  . Alcohol use No    Review of Systems  Constitutional: Negative for fever. Cardiovascular: Positive for chest pain. Respiratory: Positive for shortness of breath. Gastrointestinal: Negative for abdominal pain, vomiting and diarrhea. Genitourinary: Negative for dysuria. Musculoskeletal: Negative for back pain. Skin: Negative for rash. Neurological:  Negative for headaches, focal weakness or numbness.  10-point ROS otherwise negative.  ____________________________________________   PHYSICAL EXAM:  VITAL SIGNS: ED Triage Vitals  Enc Vitals Group     BP 12/12/15 0901 127/67     Pulse Rate 12/12/15 0901 78     Resp 12/12/15 0901 18     Temp 12/12/15 0901 98 F (36.7 C)     Temp src --      SpO2 12/12/15 0901 98 %     Weight 12/12/15 0901 150 lb (68 kg)     Height 12/12/15 0901 5\' 6"  (1.676 m)     Head Circumference --      Peak Flow --      Pain Score 12/12/15 0909 7   Constitutional: Alert and oriented. Appears slightly anxious. Eyes: Conjunctivae are normal. Normal extraocular movements. ENT   Head: Normocephalic and atraumatic.   Nose: No congestion/rhinnorhea.   Mouth/Throat: Mucous membranes are moist.   Neck: No stridor. Hematological/Lymphatic/Immunilogical: No cervical lymphadenopathy. Cardiovascular: Normal rate, regular rhythm.  No murmurs, rubs, or gallops. Respiratory: Normal respiratory effort without tachypnea nor retractions. Breath sounds are clear and equal bilaterally. No wheezes/rales/rhonchi. Gastrointestinal: Soft and nontender. No distention.  Genitourinary: Deferred Musculoskeletal: Normal range of motion in all extremities. No lower extremity edema. Neurologic:  Normal speech and language. No gross focal neurologic deficits are appreciated.  Skin:  Skin is warm, dry and intact. No rash noted. Psychiatric: Mood and affect are normal. Speech and behavior are normal. Patient exhibits appropriate insight and judgment.  ____________________________________________  LABS (pertinent positives/negatives)  Labs Reviewed  BASIC METABOLIC PANEL - Abnormal; Notable for the following:       Result Value   Glucose, Bld 102 (*)    All other components within normal limits  CBC - Abnormal; Notable for the following:    WBC 15.5 (*)    All other components within normal limits  TROPONIN I      ____________________________________________   EKG   I, Phineas SemenGraydon Rubin Dais, attending physician, personally viewed and interpreted this EKG  EKG Time: 0857 Rate: 78 Rhythm: normal sinus rhythm Axis: normal Intervals: qtc 421 QRS: narrow ST changes: no st elevation Impression: normal ekg   ____________________________________________    RADIOLOGY  CXR IMPRESSION:  No abnormality noted.    ____________________________________________   PROCEDURES  Procedures  ____________________________________________   INITIAL IMPRESSION / ASSESSMENT AND PLAN / ED COURSE  Pertinent labs & imaging results that were available during my care of the patient were reviewed by me and considered in my medical decision making (see chart for details).  Patient with right sided lower chest pain. On exam patient is slightly anxious. No abdominal tenderness. Workup without any concerning findings. At this point I doubt ACS given negative EKG and troponin. Think anxiety is playing a role. Would also consider gastritis. Will give antacid and sucralfate.  ____________________________________________   FINAL CLINICAL IMPRESSION(S) / ED DIAGNOSES  Final diagnoses:  Atypical chest pain      Phineas SemenGraydon Dashia Caldeira, MD 12/12/15 1127

## 2015-12-12 NOTE — ED Notes (Signed)
AAOx3.  Skin warm and dry.  No SOB/DOE.  Patient tearful.  States that she fees anxious and stressed out about everything and that she worries about everything.  C/O intermittent right sided chest pain, denies current complaint.  Emotional support and reassurance given, well received.  Continue to monitor.

## 2016-02-15 ENCOUNTER — Emergency Department: Payer: BLUE CROSS/BLUE SHIELD

## 2016-02-15 ENCOUNTER — Emergency Department
Admission: EM | Admit: 2016-02-15 | Discharge: 2016-02-15 | Disposition: A | Discharge status: Home / Self Care | Payer: BLUE CROSS/BLUE SHIELD | Attending: Emergency Medicine | Admitting: Emergency Medicine

## 2016-02-15 ENCOUNTER — Encounter: Payer: Self-pay | Admitting: *Deleted

## 2016-02-15 DIAGNOSIS — Y929 Unspecified place or not applicable: Secondary | ICD-10-CM | POA: Diagnosis not present

## 2016-02-15 DIAGNOSIS — F17210 Nicotine dependence, cigarettes, uncomplicated: Secondary | ICD-10-CM | POA: Insufficient documentation

## 2016-02-15 DIAGNOSIS — S61452A Open bite of left hand, initial encounter: Secondary | ICD-10-CM

## 2016-02-15 DIAGNOSIS — S61031A Puncture wound without foreign body of right thumb without damage to nail, initial encounter: Secondary | ICD-10-CM | POA: Insufficient documentation

## 2016-02-15 DIAGNOSIS — S61451A Open bite of right hand, initial encounter: Secondary | ICD-10-CM

## 2016-02-15 DIAGNOSIS — Y999 Unspecified external cause status: Secondary | ICD-10-CM | POA: Insufficient documentation

## 2016-02-15 DIAGNOSIS — Y9389 Activity, other specified: Secondary | ICD-10-CM | POA: Diagnosis not present

## 2016-02-15 DIAGNOSIS — W540XXA Bitten by dog, initial encounter: Secondary | ICD-10-CM | POA: Insufficient documentation

## 2016-02-15 DIAGNOSIS — S61233A Puncture wound without foreign body of left middle finger without damage to nail, initial encounter: Secondary | ICD-10-CM | POA: Diagnosis not present

## 2016-02-15 MED ORDER — AMOXICILLIN-POT CLAVULANATE 875-125 MG PO TABS: 1.0000 | ORAL_TABLET | Freq: Two times a day (BID) | ORAL | 0 refills | Status: AC

## 2016-02-15 MED ORDER — FLUCONAZOLE 150 MG PO TABS: 150.0000 mg | ORAL_TABLET | Freq: Every day | ORAL | 0 refills | Status: DC

## 2016-02-15 MED ORDER — AMOXICILLIN-POT CLAVULANATE 875-125 MG PO TABS
1.0000 | ORAL_TABLET | Freq: Once | ORAL | Status: AC
  Administered 2016-02-15: 1 via ORAL
  Filled 2016-02-15: qty 1

## 2016-02-15 NOTE — Discharge Instructions (Signed)
Area clean and dry. Wash daily with mild soap and water. Take Augmentin twice a day for 10 days. The first dose was given to you in the emergency room tonight. Here may take Tylenol or ibuprofen as needed for pain. [facility] if any continued problems. Return to the emergency room if any severe worsening of your symptoms.

## 2016-02-15 NOTE — ED Provider Notes (Signed)
[name] Provider Note  ____________________________________________   First MD Initiated Contact with Patient 02/15/16 1556     (approximate)  I have reviewed the triage vital signs and the nursing notes.   HISTORY  Chief Complaint Animal Bite   HPI Margaret Gonzales is a 35 y.o. female for complaint of dog bite. Patient states that she was breaking up a fight between her pitbull and her smaller dog 2 days ago. Patient states she has puncture wound to her right thumb and 2 separate punctures to her left hand.  Patient states she has been cleaning her hand but noticed that on her left hand that her third finger has been getting more swollen and tender. She denies any drainage from the areas. She states that she is up-to-date on her tetanus immunization. Her dog is up-to-date on his shots. Currently rates she rates her pain is 3/10.   Past Medical History:  Diagnosis Date  . Depression   . Kidney stones     Patient Active Problem List   Diagnosis Date Noted  . Depression 03/29/2015  . Anxiety disorder 03/29/2015  . Migraine 02/15/2015  . Encounter to establish care 02/15/2015  . Smoker 02/15/2015  . Neck pain on right side 02/15/2015    History reviewed. No pertinent surgical history.  Prior to Admission medications   Medication Sig Start Date End Date Taking? Authorizing Provider  amoxicillin-clavulanate (AUGMENTIN) 875-125 MG tablet Take 1 tablet by mouth 2 (two) times daily. 02/15/16 02/22/16  Tommi Rumpshonda L Summers, PA-C  fluconazole (DIFLUCAN) 150 MG tablet Take 1 tablet (150 mg total) by mouth daily. 02/15/16   Tommi Rumpshonda L Summers, PA-C  ranitidine (ZANTAC) 150 MG tablet Take 1 tablet (150 mg total) by mouth 2 (two) times daily. 12/12/15 12/11/16  Phineas SemenGraydon Goodman, MD  sucralfate (CARAFATE) 1 g tablet Take 1 tablet (1 g total) by mouth 4 (four) times daily. 12/12/15   Phineas SemenGraydon Goodman, MD    Allergies Lexapro [escitalopram  oxalate]  Family History  Problem Relation Age of Onset  . Diabetes Mother     Social History Social History  Substance Use Topics  . Smoking status: Current Every Day Smoker    Packs/day: 1.50    Types: Cigarettes  . Smokeless tobacco: Never Used  . Alcohol use No    Review of Systems Constitutional: No fever/chills Cardiovascular: Denies chest pain. Respiratory: Denies shortness of breath. Gastrointestinal:   No nausea, no vomiting.  Genitourinary: Negative for dysuria. Musculoskeletal: Positive for right thumb pain and left hand pain secondary to puncture wounds. Skin: Positive for dog bite. Neurological: Negative for headaches, focal weakness or numbness.  10-point ROS otherwise negative.  ____________________________________________   PHYSICAL EXAM:  VITAL SIGNS: ED Triage Vitals [02/15/16 1542]  Enc Vitals Group     BP 109/87     Pulse Rate 92     Resp 18     Temp 98.3 F (36.8 C)     Temp Source Oral     SpO2 98 %     Weight 155 lb (70.3 kg)     Height 5\' 7"  (1.702 m)     Head Circumference      Peak Flow      Pain Score 3     Pain Loc      Pain Edu?      Excl. in GC?     Constitutional: Alert and oriented. Well appearing and in no acute distress. Eyes: Conjunctivae are normal. PERRL.  EOMI. Head: Atraumatic. Nose: No congestion/rhinnorhea. Neck: No stridor.   Cardiovascular: Normal rate, regular rhythm. Grossly normal heart sounds.  Good peripheral circulation. Respiratory: Normal respiratory effort.  No retractions. Lungs CTAB. Musculoskeletal: Patient is able to flex and extend all digits without any difficulty. There is tenderness around the puncture wound of the left thumb without any drainage. There is no warmth and minimal redness is seen. Capillary refill is less than 3 seconds. One single puncture is noted. On examination of left hand there is a puncture wound of the third finger with erythema and tenderness at the base of the nail. Nail is  without injury. No drainage is noted. Neurologic:  Normal speech and language. No gross focal neurologic deficits are appreciated. No gait instability. Skin:  Skin is warm, dry and intact. As noted above. Psychiatric: Mood and affect are normal. Speech and behavior are normal.  ____________________________________________   LABS (all labs ordered are listed, but only abnormal results are displayed)  Labs Reviewed - No data to display  RADIOLOGY  No foreign body is noted to the right thumb and left hand. No fracture. The radiologist. ____________________________________________   PROCEDURES  Procedure(s) performed: None  Procedures  Critical Care performed: No  ____________________________________________   INITIAL IMPRESSION / ASSESSMENT AND PLAN / ED COURSE  Pertinent labs & imaging results that were available during my care of the patient were reviewed by me and considered in my medical decision making (see chart for details).    Clinical Course    Patient was given a prescription for Augmentin and given one tablet while in the emergency room. Patient is continue watching for any further complications. Should she began running fever or having more difficulty she is return to the emergency room over the holiday weekend. She will also follow[facility] if any continued problems.  ____________________________________________   FINAL CLINICAL IMPRESSION(S) / ED DIAGNOSES  Final diagnoses:  Dog bite of right hand, initial encounter  Dog bite of left hand, initial encounter      NEW MEDICATIONS STARTED DURING THIS VISIT:  Discharge Medication List as of 02/15/2016  5:05 PM    START taking these medications   Details  amoxicillin-clavulanate (AUGMENTIN) 875-125 MG tablet Take 1 tablet by mouth 2 (two) times daily., Starting Wed 02/15/2016, Until Wed 02/22/2016, Print         Note:  This document was prepared using Dragon voice recognition  software and may include unintentional dictation errors.    Tommi RumpsRhonda L Summers, PA-C 02/15/16 1729    Nita Sicklearolina Veronese, MD 02/16/16 2231

## 2016-02-15 NOTE — ED Triage Notes (Signed)
Pt states her large dog bit her 2 days ago, puncture on right thumb and left hand, states dogs are up to date on vaccines

## 2016-10-04 ENCOUNTER — Emergency Department
Admission: EM | Admit: 2016-10-04 | Discharge: 2016-10-04 | Disposition: A | Discharge status: Home / Self Care | Payer: BLUE CROSS/BLUE SHIELD | Attending: Emergency Medicine | Admitting: Emergency Medicine

## 2016-10-04 ENCOUNTER — Encounter: Payer: Self-pay | Admitting: Medical Oncology

## 2016-10-04 DIAGNOSIS — F17210 Nicotine dependence, cigarettes, uncomplicated: Secondary | ICD-10-CM | POA: Insufficient documentation

## 2016-10-04 DIAGNOSIS — R21 Rash and other nonspecific skin eruption: Secondary | ICD-10-CM | POA: Diagnosis present

## 2016-10-04 DIAGNOSIS — Z79899 Other long term (current) drug therapy: Secondary | ICD-10-CM | POA: Insufficient documentation

## 2016-10-04 DIAGNOSIS — T7840XA Allergy, unspecified, initial encounter: Secondary | ICD-10-CM

## 2016-10-04 DIAGNOSIS — B880 Other acariasis: Secondary | ICD-10-CM

## 2016-10-04 MED ORDER — DIPHENHYDRAMINE HCL 25 MG PO CAPS: 25.0000 mg | ORAL_CAPSULE | ORAL | 0 refills | Status: DC | PRN

## 2016-10-04 MED ORDER — FAMOTIDINE 20 MG PO TABS
20.0000 mg | ORAL_TABLET | Freq: Once | ORAL | Status: AC
  Administered 2016-10-04: 20 mg via ORAL
  Filled 2016-10-04: qty 1

## 2016-10-04 MED ORDER — METHYLPREDNISOLONE SODIUM SUCC 125 MG IJ SOLR
125.0000 mg | Freq: Once | INTRAMUSCULAR | Status: AC
  Administered 2016-10-04: 125 mg via INTRAMUSCULAR
  Filled 2016-10-04: qty 2

## 2016-10-04 MED ORDER — DIPHENHYDRAMINE HCL 50 MG/ML IJ SOLN
25.0000 mg | Freq: Once | INTRAMUSCULAR | Status: AC
  Administered 2016-10-04: 25 mg via INTRAMUSCULAR
  Filled 2016-10-04: qty 1

## 2016-10-04 MED ORDER — ONDANSETRON 4 MG PO TBDP
4.0000 mg | ORAL_TABLET | Freq: Once | ORAL | Status: AC
  Administered 2016-10-04: 4 mg via ORAL
  Filled 2016-10-04: qty 1

## 2016-10-04 MED ORDER — PREDNISONE 10 MG (21) PO TBPK: ORAL_TABLET | ORAL | 0 refills | Status: DC

## 2016-10-04 NOTE — ED Provider Notes (Signed)
[facility] Emergency Department Provider Note  ____________________________________________  Time seen: Approximately 10:50 AM  I have reviewed the triage vital signs and the nursing notes.   HISTORY  Chief Complaint Rash    HPI Margaret Gonzales is a 36 y.o. female that presents to the emergency department with a worsening rash for 3 days. Patient was working outside and was exposed to a chigger bed. She has also been working outside Engineer, water. Rash started out on ankles and wrists and has been spreading since. Rash is now covering her arms and starting in her low back. It is extremely itchy. She has felt some nausea. She took Benadryl last night, which helped the itching. No fever, shortness breath, chest pain, vomiting, abdominal pain.   Past Medical History:  Diagnosis Date  . Depression   . Kidney stones     Patient Active Problem List   Diagnosis Date Noted  . Depression 03/29/2015  . Anxiety disorder 03/29/2015  . Migraine 02/15/2015  . Encounter to establish care 02/15/2015  . Smoker 02/15/2015  . Neck pain on right side 02/15/2015    No past surgical history on file.  Prior to Admission medications   Medication Sig Start Date End Date Taking? Authorizing Provider  diphenhydrAMINE (BENADRYL) 25 mg capsule Take 1 capsule (25 mg total) by mouth every 4 (four) hours as needed. 10/04/16 10/04/17  Enid Derry, PA-C  fluconazole (DIFLUCAN) 150 MG tablet Take 1 tablet (150 mg total) by mouth daily. 02/15/16   Tommi Rumps, PA-C  predniSONE (STERAPRED UNI-PAK 21 TAB) 10 MG (21) TBPK tablet Take 6 tablets on day 1, take 5 tablets on day 2, take 4 tablets on day 3, take 3 tablets on day 4, take 2 tablets on day 5, take 1 tablet on day 6 10/04/16   Enid Derry, PA-C  ranitidine (ZANTAC) 150 MG tablet Take 1 tablet (150 mg total) by mouth 2 (two) times daily. 12/12/15 12/11/16  Phineas Semen, MD  sucralfate (CARAFATE) 1 g tablet Take 1 tablet (1 g  total) by mouth 4 (four) times daily. 12/12/15   Phineas Semen, MD    Allergies Lexapro [escitalopram oxalate]  Family History  Problem Relation Age of Onset  . Diabetes Mother     Social History Social History  Substance Use Topics  . Smoking status: Current Every Day Smoker    Packs/day: 1.50    Types: Cigarettes  . Smokeless tobacco: Never Used  . Alcohol use No     Review of Systems  Constitutional: No fever/chills Cardiovascular: No chest pain. Respiratory: No SOB. Gastrointestinal: No abdominal pain.  No vomiting.  Musculoskeletal: Negative for musculoskeletal pain. Skin: Negative for abrasions, lacerations, ecchymosis. Neurological: Negative for headaches, numbness or tingling   ____________________________________________   PHYSICAL EXAM:  VITAL SIGNS: ED Triage Vitals  Enc Vitals Group     BP 10/04/16 0942 111/79     Pulse Rate 10/04/16 0942 86     Resp 10/04/16 0942 16     Temp 10/04/16 0942 98.2 F (36.8 C)     Temp Source 10/04/16 0942 Oral     SpO2 10/04/16 0942 97 %     Weight 10/04/16 0943 156 lb (70.8 kg)     Height 10/04/16 0943 5\' 7"  (1.702 m)     Head Circumference --      Peak Flow --      Pain Score 10/04/16 0942 0     Pain Loc --  Pain Edu? --      Excl. in GC? --      Constitutional: Alert and oriented. Well appearing and in no acute distress. Eyes: Conjunctivae are normal. PERRL. EOMI. Head: Atraumatic. ENT:      Ears:      Nose: No congestion/rhinnorhea.      Mouth/Throat: Mucous membranes are moist.  Neck: No stridor.   Cardiovascular: Normal rate, regular rhythm.  Good peripheral circulation. Respiratory: Normal respiratory effort without tachypnea or retractions. Lungs CTAB. Good air entry to the bases with no decreased or absent breath sounds. Gastrointestinal: Bowel sounds 4 quadrants. Soft and nontender to palpation. No guarding or rigidity. No palpable masses. No distention.  Musculoskeletal: Full range of  motion to all extremities. No gross deformities appreciated. Neurologic:  Normal speech and language. No gross focal neurologic deficits are appreciated.  Skin:  Skin is warm, dry and intact. 1 mm scabbed lesions to right wrist and right ankle. Wheals to bilateral upper extremities and low back.   ____________________________________________   LABS (all labs ordered are listed, but only abnormal results are displayed)  Labs Reviewed - No data to display ____________________________________________  EKG   ____________________________________________  RADIOLOGY  No results found.  ____________________________________________    PROCEDURES  Procedure(s) performed:    Procedures    Medications  methylPREDNISolone sodium succinate (SOLU-MEDROL) 125 mg/2 mL injection 125 mg (125 mg Intramuscular Given 10/04/16 1104)  diphenhydrAMINE (BENADRYL) injection 25 mg (25 mg Intramuscular Given 10/04/16 1104)  famotidine (PEPCID) tablet 20 mg (20 mg Oral Given 10/04/16 1104)  ondansetron (ZOFRAN-ODT) disintegrating tablet 4 mg (4 mg Oral Given 10/04/16 1105)     ____________________________________________   INITIAL IMPRESSION / ASSESSMENT AND PLAN / ED COURSE  Pertinent labs & imaging results that were available during my care of the patient were reviewed by me and considered in my medical decision making (see chart for details).  Review of the [place] CSRS was performed in accordance of the [facility] prior to dispensing any controlled drugs.   She presented to the emergency department for evaluation of a rash for 4 days. Vital signs and exam are reassuring. Patient was exposed to chiggers. Patient does have some bites to her ankles and wrists and it appears she is having an allergic reaction to bites. She was given Solu-Medrol, Benadryl, Pepcid in ED. Patient will be discharged home with prescriptions for prednisone and Benadryl. Patient is to follow up with PCP as directed. Patient is given  ED precautions to return to the ED for any worsening or new symptoms.     ____________________________________________  FINAL CLINICAL IMPRESSION(S) / ED DIAGNOSES  Final diagnoses:  Chiggers  Allergic reaction, initial encounter      NEW MEDICATIONS STARTED DURING THIS VISIT:  Discharge Medication List as of 10/04/2016 11:26 AM    START taking these medications   Details  diphenhydrAMINE (BENADRYL) 25 mg capsule Take 1 capsule (25 mg total) by mouth every 4 (four) hours as needed., Starting Thu 10/04/2016, Until Fri 10/04/2017, Print    predniSONE (STERAPRED UNI-PAK 21 TAB) 10 MG (21) TBPK tablet Take 6 tablets on day 1, take 5 tablets on day 2, take 4 tablets on day 3, take 3 tablets on day 4, take 2 tablets on day 5, take 1 tablet on day 6, Print            This chart was dictated using voice recognition software/Dragon. Despite best efforts to proofread, errors can occur which can change the meaning. Any  change was purely unintentional.    Corita, Allinson, PA-C 10/04/16 1824    Pershing Proud Myra Rude, MD 10/05/16 2350

## 2016-10-04 NOTE — ED Notes (Signed)
Pt reports rash noted over body since this weekend. Pt reports she worked outside this weekend and thought it was mosquito bites. Pt reports has been taken benadryl with no relief. Pt reports lives with her boyfriend and he had the same rash but is has cleared up on him. Pt reports has switched laundry scents. Pt reports area itches and is making her miserable. Pt with numerous red welts noted over body.

## 2016-10-04 NOTE — ED Triage Notes (Signed)
Pt reports itchy rash that is all over began Sunday, pt thinks possibly chiggers.

## 2016-10-04 NOTE — ED Notes (Signed)
First Nurse Note:  Red round rash noted on arms and legs.  Patient states it began on Sat. Itching.  Patient states she is feeling nauseous now.  Patient states she was in a field with weeds on Sunday.

## 2016-10-07 ENCOUNTER — Encounter: Payer: Self-pay | Admitting: Emergency Medicine

## 2016-10-07 ENCOUNTER — Emergency Department
Admission: EM | Admit: 2016-10-07 | Discharge: 2016-10-07 | Disposition: A | Discharge status: Home / Self Care | Payer: BLUE CROSS/BLUE SHIELD | Attending: Emergency Medicine | Admitting: Emergency Medicine

## 2016-10-07 DIAGNOSIS — F17210 Nicotine dependence, cigarettes, uncomplicated: Secondary | ICD-10-CM | POA: Insufficient documentation

## 2016-10-07 DIAGNOSIS — Z79899 Other long term (current) drug therapy: Secondary | ICD-10-CM | POA: Diagnosis not present

## 2016-10-07 DIAGNOSIS — R21 Rash and other nonspecific skin eruption: Secondary | ICD-10-CM | POA: Diagnosis present

## 2016-10-07 DIAGNOSIS — B86 Scabies: Secondary | ICD-10-CM | POA: Diagnosis not present

## 2016-10-07 MED ORDER — IVERMECTIN 3 MG PO TABS
200.0000 ug/kg | ORAL_TABLET | Freq: Once | ORAL | Status: AC
  Administered 2016-10-07: 13500 ug via ORAL
  Filled 2016-10-07: qty 5

## 2016-10-07 MED ORDER — IVERMECTIN 3 MG PO TABS: 200.0000 ug/kg | ORAL_TABLET | Freq: Once | ORAL | 0 refills | Status: AC

## 2016-10-07 NOTE — ED Triage Notes (Signed)
Here for generalized rash.  Significant other has same.  Rash appears as hives. Itching in nature. Has been seen and on steroids.  Significant other being treated for scabies.

## 2016-10-07 NOTE — ED Provider Notes (Signed)
[facility] Emergency Department Provider Note  ____________________________________________  Time seen: Approximately 3:50 PM  I have reviewed the triage vital signs and the nursing notes.   HISTORY  Chief Complaint Rash    HPI Margaret Gonzales is a 36 y.o. female presenting to the emergency department with a diffuse, erythematous, macular rash of the upper and lower extremities and back. She states that she was treated on 10/04/2016 for chiggers with prednisone. Patient has known contacts with scabies. She denies nausea, vomiting or abdominal pain. No dysphagia, chest tightness or shortness of breath.   Past Medical History:  Diagnosis Date  . Depression   . Kidney stones     Patient Active Problem List   Diagnosis Date Noted  . Depression 03/29/2015  . Anxiety disorder 03/29/2015  . Migraine 02/15/2015  . Encounter to establish care 02/15/2015  . Smoker 02/15/2015  . Neck pain on right side 02/15/2015    History reviewed. No pertinent surgical history.  Prior to Admission medications   Medication Sig Start Date End Date Taking? Authorizing Provider  diphenhydrAMINE (BENADRYL) 25 mg capsule Take 1 capsule (25 mg total) by mouth every 4 (four) hours as needed. 10/04/16 10/04/17  Enid Derry, PA-C  fluconazole (DIFLUCAN) 150 MG tablet Take 1 tablet (150 mg total) by mouth daily. 02/15/16   Tommi Rumps, PA-C  predniSONE (STERAPRED UNI-PAK 21 TAB) 10 MG (21) TBPK tablet Take 6 tablets on day 1, take 5 tablets on day 2, take 4 tablets on day 3, take 3 tablets on day 4, take 2 tablets on day 5, take 1 tablet on day 6 10/04/16   Enid Derry, PA-C  ranitidine (ZANTAC) 150 MG tablet Take 1 tablet (150 mg total) by mouth 2 (two) times daily. 12/12/15 12/11/16  Phineas Semen, MD  sucralfate (CARAFATE) 1 g tablet Take 1 tablet (1 g total) by mouth 4 (four) times daily. 12/12/15   Phineas Semen, MD    Allergies Lexapro [escitalopram  oxalate]  Family History  Problem Relation Age of Onset  . Diabetes Mother     Social History Social History  Substance Use Topics  . Smoking status: Current Every Day Smoker    Packs/day: 1.50    Types: Cigarettes  . Smokeless tobacco: Never Used  . Alcohol use No    Review of Systems  Constitutional: No fever/chills Eyes: No visual changes. No discharge ENT: No upper respiratory complaints. Cardiovascular: no chest pain. Respiratory: no cough. No SOB. Gastrointestinal: No abdominal pain.  No nausea, no vomiting.  No diarrhea.  No constipation. Musculoskeletal: Negative for musculoskeletal pain. Skin: Patient has rash.  Neurological: Negative for headaches, focal weakness or numbness. ____________________________________________   PHYSICAL EXAM:  VITAL SIGNS: ED Triage Vitals  Enc Vitals Group     BP 10/07/16 1541 123/68     Pulse Rate 10/07/16 1541 89     Resp 10/07/16 1541 18     Temp 10/07/16 1541 98.4 F (36.9 C)     Temp Source 10/07/16 1541 Oral     SpO2 10/07/16 1541 96 %     Weight 10/07/16 1534 156 lb (70.8 kg)     Height 10/07/16 1534 5\' 7"  (1.702 m)     Head Circumference --      Peak Flow --      Pain Score --      Pain Loc --      Pain Edu? --      Excl. in GC? --  Constitutional: Alert and oriented. Well appearing and in no acute distress. Eyes: Conjunctivae are normal. PERRL. EOMI. Head: Atraumatic. Cardiovascular: Normal rate, regular rhythm. Normal S1 and S2.  Good peripheral circulation. Respiratory: Normal respiratory effort without tachypnea or retractions. Lungs CTAB. Good air entry to the bases with no decreased or absent breath sounds. Musculoskeletal: Full range of motion to all extremities. No gross deformities appreciated. Neurologic:  Normal speech and language. No gross focal neurologic deficits are appreciated.  Skin: Patient has erythematous, macular rash with secondary excoriations. Psychiatric: Mood and affect are  normal. Speech and behavior are normal. Patient exhibits appropriate insight and judgement.   ____________________________________________   LABS (all labs ordered are listed, but only abnormal results are displayed)  Labs Reviewed - No data to display ____________________________________________  EKG   ____________________________________________  RADIOLOGY   No results found.  ____________________________________________    PROCEDURES  Procedure(s) performed:    Procedures    Medications  ivermectin (STROMECTOL) tablet 13,500 mcg (not administered)     ____________________________________________   INITIAL IMPRESSION / ASSESSMENT AND PLAN / ED COURSE  Pertinent labs & imaging results that were available during my care of the patient were reviewed by me and considered in my medical decision making (see chart for details).  Review of the [place] CSRS was performed in accordance of the [facility] prior to dispensing any controlled drugs.      Assessment and plan Scabies Patient presents to the emergency department with a diffuse rash of the upper and lower extremities consistent with scabies. Patient was treated with ivermectin in the emergency department. Patient was discharged with a prescription for ivermectin with instructions to take secondary dose in two weeks if symptoms persist. All patient questions were answered.  ____________________________________________  FINAL CLINICAL IMPRESSION(S) / ED DIAGNOSES  Final diagnoses:  None      NEW MEDICATIONS STARTED DURING THIS VISIT:  New Prescriptions   No medications on file        This chart was dictated using voice recognition software/Dragon. Despite best efforts to proofread, errors can occur which can change the meaning. Any change was purely unintentional.    Gasper LloydWoods, Chaze Hruska M, PA-C 10/07/16 1556    Sharman CheekStafford, Phillip, MD 10/08/16 2328

## 2016-10-10 ENCOUNTER — Emergency Department: Payer: BLUE CROSS/BLUE SHIELD

## 2016-10-10 ENCOUNTER — Encounter: Payer: Self-pay | Admitting: Emergency Medicine

## 2016-10-10 ENCOUNTER — Emergency Department
Admission: EM | Admit: 2016-10-10 | Discharge: 2016-10-10 | Disposition: A | Discharge status: Home / Self Care | Payer: BLUE CROSS/BLUE SHIELD | Attending: Student in an Organized Health Care Education/Training Program | Admitting: Student in an Organized Health Care Education/Training Program

## 2016-10-10 DIAGNOSIS — M79671 Pain in right foot: Secondary | ICD-10-CM | POA: Diagnosis not present

## 2016-10-10 DIAGNOSIS — M79672 Pain in left foot: Secondary | ICD-10-CM | POA: Insufficient documentation

## 2016-10-10 DIAGNOSIS — R2243 Localized swelling, mass and lump, lower limb, bilateral: Secondary | ICD-10-CM | POA: Diagnosis present

## 2016-10-10 DIAGNOSIS — F17210 Nicotine dependence, cigarettes, uncomplicated: Secondary | ICD-10-CM | POA: Insufficient documentation

## 2016-10-10 LAB — BASIC METABOLIC PANEL
Anion gap: 8 (ref 5–15)
BUN: 15 mg/dL (ref 6–20)
CALCIUM: 8.7 mg/dL — AB (ref 8.9–10.3)
CO2: 24 mmol/L (ref 22–32)
Chloride: 106 mmol/L (ref 101–111)
Creatinine, Ser: 1.07 mg/dL — ABNORMAL HIGH (ref 0.44–1.00)
GFR calc Af Amer: >60 mL/min (ref >60)
GLUCOSE: 85 mg/dL (ref 65–99)
Potassium: 3.9 mmol/L (ref 3.5–5.1)
SODIUM: 138 mmol/L (ref 135–145)

## 2016-10-10 LAB — CBC WITH DIFFERENTIAL/PLATELET
BASOS ABS: 0.1 10*3/uL (ref 0–0.1)
Basophils Relative: 1 %
EOS ABS: 0.7 10*3/uL (ref 0–0.7)
EOS PCT: 5 %
HCT: 40.1 % (ref 35.0–47.0)
Hemoglobin: 13.5 g/dL (ref 12.0–16.0)
Lymphocytes Relative: 24 %
Lymphs Abs: 3.5 10*3/uL (ref 1.0–3.6)
MCH: 29.2 pg (ref 26.0–34.0)
MCHC: 33.5 g/dL (ref 32.0–36.0)
MCV: 87.1 fL (ref 80.0–100.0)
MONO ABS: 1.5 10*3/uL — AB (ref 0.2–0.9)
Monocytes Relative: 10 %
NEUTROS ABS: 8.9 10*3/uL — AB (ref 1.4–6.5)
Neutrophils Relative %: 60 %
PLATELETS: 319 10*3/uL (ref 150–440)
RBC: 4.61 MIL/uL (ref 3.80–5.20)
RDW: 14.2 % (ref 11.5–14.5)
WBC: 14.7 10*3/uL — AB (ref 3.6–11.0)

## 2016-10-10 LAB — POCT PREGNANCY, URINE: PREG TEST UR: NEGATIVE

## 2016-10-10 MED ORDER — CEPHALEXIN 500 MG PO CAPS: 500.0000 mg | ORAL_CAPSULE | Freq: Three times a day (TID) | ORAL | 0 refills | Status: DC

## 2016-10-10 MED ORDER — NAPROXEN 500 MG PO TABS: 500.0000 mg | ORAL_TABLET | Freq: Two times a day (BID) | ORAL | 0 refills | Status: DC

## 2016-10-10 NOTE — ED Notes (Signed)
See triage note  States she developed swelling to both feet couple of days ago   States swelling is in feet and moving into ankles  Denies any injury   Min swelling noted  Positive pulses noted

## 2016-10-10 NOTE — Discharge Instructions (Signed)
Begin taking Keflex 500 mg 3 times a day for the next 10 days. Naproxen 500 mg twice a day with food. Also wear supportive shoes instead of flip-flops. Obtain Eucerin cream and applied to your feet twice a day especially where your feet for cracking. You may also put a layer of Eucerin cream on your  feet and then put a pair of socks on at night which will increase the moisture and your feet. Call and make an appointment with Dr. Orland Jarredroxler who is the podiatrist on-call.

## 2016-10-10 NOTE — ED Triage Notes (Signed)
Pt in via POV with complaints of bilateral ankle pain and swelling since Monday.  Pt denies any recent injury.  Pedal pulses palpable and equal.  Pt reports being seen here Sunday and given "pill for Scabies."  Pt ambulatory to triage room.  NAD noted at this time.

## 2016-10-10 NOTE — ED Provider Notes (Signed)
[facility] Emergency Department Provider Note   ____________________________________________   First MD Initiated Contact with Patient 10/10/16 1142     (approximate)  I have reviewed the triage vital signs and the nursing notes.   HISTORY  Chief Complaint Ankle Pain   HPI Margaret Gonzales is a 36 y.o. female is here complaining of bilateral feet pain since Monday. Patient states that she feels that her feet are swelling. She denies any recent injury. She states she was seen Sunday and given a by mouth for scabies. She does not have a PCP. She rates her pain as a 7 out of 10.   Past Medical History:  Diagnosis Date  . Depression   . Kidney stones     Patient Active Problem List   Diagnosis Date Noted  . Depression 03/29/2015  . Anxiety disorder 03/29/2015  . Migraine 02/15/2015  . Encounter to establish care 02/15/2015  . Smoker 02/15/2015  . Neck pain on right side 02/15/2015    History reviewed. No pertinent surgical history.  Prior to Admission medications   Medication Sig Start Date End Date Taking? Authorizing Provider  cephALEXin (KEFLEX) 500 MG capsule Take 1 capsule (500 mg total) by mouth 3 (three) times daily. 10/10/16   Tommi Rumps, PA-C  naproxen (NAPROSYN) 500 MG tablet Take 1 tablet (500 mg total) by mouth 2 (two) times daily with a meal. 10/10/16   Tommi Rumps, PA-C    Allergies Lexapro [escitalopram oxalate]  Family History  Problem Relation Age of Onset  . Diabetes Mother     Social History Social History  Substance Use Topics  . Smoking status: Current Every Day Smoker    Packs/day: 1.50    Types: Cigarettes  . Smokeless tobacco: Never Used  . Alcohol use No    Review of Systems Constitutional: No fever/chills Cardiovascular: Denies chest pain. Respiratory: Denies shortness of breath. Gastrointestinal:   No nausea, no vomiting.   Musculoskeletal: Positive bilateral feet pain. Skin: Positive for  cracks in her feet. Neurological: Negative for headaches, focal weakness or numbness.   ____________________________________________   PHYSICAL EXAM:  VITAL SIGNS: ED Triage Vitals [10/10/16 1114]  Enc Vitals Group     BP 124/72     Pulse Rate 94     Resp 18     Temp 97.8 F (36.6 C)     Temp Source Oral     SpO2 98 %     Weight 155 lb (70.3 kg)     Height 5\' 7"  (1.702 m)     Head Circumference      Peak Flow      Pain Score 7     Pain Loc      Pain Edu?      Excl. in GC?     Constitutional: Alert and oriented. Well appearing and in no acute distress. Eyes: Conjunctivae are normal.  Head: Atraumatic. Neck: No stridor.   Cardiovascular: Normal rate, regular rhythm. Grossly normal heart sounds.  Good peripheral circulation. Respiratory: Normal respiratory effort.  No retractions. Lungs CTAB. Musculoskeletal: Examination of the there is no gross deformity however the soles of her feet are mildly erythematous. The skin is well with open areas especially on the heels. There is no drainage from the areas. Tender to palpation. Motor sensory function intact. Range of motion is without restriction. Neurologic:  Normal speech and language. No gross focal neurologic deficits are appreciated.  Skin:  Skin is warm, dry. Skin as  noted above. Psychiatric: Mood and affect are normal. Speech and behavior are normal.  ____________________________________________   LABS (all labs ordered are listed, but only abnormal results are displayed)  Labs Reviewed  CBC WITH DIFFERENTIAL/PLATELET - Abnormal; Notable for the following:       Result Value   WBC 14.7 (*)    Neutro Abs 8.9 (*)    Monocytes Absolute 1.5 (*)    All other components within normal limits  BASIC METABOLIC PANEL - Abnormal; Notable for the following:    Creatinine, Ser 1.07 (*)    Calcium 8.7 (*)    All other components within normal limits  POC URINE PREG, ED  POCT PREGNANCY, URINE    RADIOLOGY  Dg Foot  Complete Right  Result Date: 10/10/2016 CLINICAL DATA:  Acute right foot edema without known injury. EXAM: RIGHT FOOT COMPLETE - 3+ VIEW COMPARISON:  None. FINDINGS: There is no evidence of fracture or dislocation. There is no evidence of arthropathy or other focal bone abnormality. Soft tissues are unremarkable. IMPRESSION: Normal right foot. Electronically Signed   By: Lupita RaiderJames  Green Jr, M.D.   On: 10/10/2016 13:37    ____________________________________________   PROCEDURES  Procedure(s) performed: None  Procedures  Critical Care performed: No  ____________________________________________   INITIAL IMPRESSION / ASSESSMENT AND PLAN / ED COURSE  Pertinent labs & imaging results that were available during my care of the patient were reviewed by me and considered in my medical decision making (see chart for details).  Patient is given a prescription for Keflex 500 mg 3 times a day for 10 days, naproxen 500 mg twice a day. She is to obtain Eucerin cream to apply to her feet to reduce the dryness and cracking. She is also referred to Dr. Orland Jarredroxler for evaluation. She was encouraged to discontinue wearing her flip-flops especially to work.   ____________________________________________   FINAL CLINICAL IMPRESSION(S) / ED DIAGNOSES  Final diagnoses:  Bilateral foot pain      NEW MEDICATIONS STARTED DURING THIS VISIT:  New Prescriptions   CEPHALEXIN (KEFLEX) 500 MG CAPSULE    Take 1 capsule (500 mg total) by mouth 3 (three) times daily.   NAPROXEN (NAPROSYN) 500 MG TABLET    Take 1 tablet (500 mg total) by mouth 2 (two) times daily with a meal.     Note:  This document was prepared using Dragon voice recognition software and may include unintentional dictation errors.    Tommi RumpsSummers, Yulisa Chirico L, PA-C 10/10/16 1437    Willy Eddyobinson, Patrick, MD 10/10/16 [phone]

## 2020-03-09 ENCOUNTER — Other Ambulatory Visit (HOSPITAL_COMMUNITY)
Admission: RE | Admit: 2020-03-09 | Discharge: 2020-03-09 | Disposition: A | Discharge status: Home / Self Care | Payer: BLUE CROSS/BLUE SHIELD | Source: Ambulatory Visit | Attending: Obstetrics & Gynecology | Admitting: Obstetrics & Gynecology

## 2020-03-09 ENCOUNTER — Ambulatory Visit (INDEPENDENT_AMBULATORY_CARE_PROVIDER_SITE_OTHER): Payer: 59 | Admitting: Obstetrics & Gynecology

## 2020-03-09 ENCOUNTER — Encounter: Payer: Self-pay | Admitting: Obstetrics & Gynecology

## 2020-03-09 ENCOUNTER — Other Ambulatory Visit: Payer: Self-pay

## 2020-03-09 VITALS — BP 110/70 | Ht 67.0 in | Wt 161.0 lb

## 2020-03-09 DIAGNOSIS — Z124 Encounter for screening for malignant neoplasm of cervix: Secondary | ICD-10-CM | POA: Insufficient documentation

## 2020-03-09 DIAGNOSIS — R1031 Right lower quadrant pain: Secondary | ICD-10-CM

## 2020-03-09 DIAGNOSIS — N920 Excessive and frequent menstruation with regular cycle: Secondary | ICD-10-CM | POA: Diagnosis not present

## 2020-03-09 DIAGNOSIS — R399 Unspecified symptoms and signs involving the genitourinary system: Secondary | ICD-10-CM | POA: Diagnosis not present

## 2020-03-09 LAB — POCT URINALYSIS DIPSTICK
Bilirubin, UA: NEGATIVE
Blood, UA: NEGATIVE
Glucose, UA: NEGATIVE
Ketones, UA: NEGATIVE
Leukocytes, UA: NEGATIVE
Protein, UA: POSITIVE — AB

## 2020-03-09 NOTE — Progress Notes (Signed)
Gynecology Pelvic Pain Evaluation   Chief Complaint  Patient presents with  . Vaginal Bleeding  . Pelvic Pain    Right side mostly  . Urinary Tract Infection    Urge x 3 weeks    History of Present Illness:   Patient is a 40 y.o. G3P0030 who LMP was Patient's last menstrual period was 02/08/2020 (exact date)., presents today for a problem visit.  She complains of abnormal bleeding and pain.   Her pain is localized to the RLQ area, described as intermittent, sharp and stabbing, began 2 mos ago and its severity is described as moderate. The pain radiates to the  Non-radiating. She has these associated symptoms which include heavy prolonged cycle for 3 weeks this last cycle (not normal for her) w dyspareunia too, and recent post coital bleeding.  Usually has reg cycles and 4-5 day cycles.  No birth control in many years.  Has been w current BF 12 years, no pregnancy.   Patient has these modifiers which include relaxation that make it better and unable to associate with any factor that make it worse.  Context includes: spontaneous.  No previous studies.  Last PAP 2016.  PMHx: She  has a past medical history of Depression and Kidney stones. Also,  has no past surgical history on file., family history includes Diabetes in her mother.,  reports that she has been smoking cigarettes. She has been smoking about 1.50 packs per day. She has never used smokeless tobacco. She reports current drug use. Frequency: 3.00 times per week. Drug: Marijuana. She reports that she does not drink alcohol.  She currently has no medications in their medication list. Also, is allergic to lexapro [escitalopram oxalate].  Review of Systems  Constitutional: Positive for malaise/fatigue. Negative for chills and fever.  HENT: Negative for congestion, sinus pain and sore throat.   Eyes: Negative for blurred vision and pain.  Respiratory: Negative for cough and wheezing.   Cardiovascular: Negative for chest pain and leg  swelling.  Gastrointestinal: Positive for abdominal pain and nausea. Negative for constipation, diarrhea, heartburn and vomiting.  Genitourinary: Positive for frequency. Negative for dysuria, hematuria and urgency.  Musculoskeletal: Positive for joint pain. Negative for back pain, myalgias and neck pain.  Skin: Negative for itching and rash.  Neurological: Negative for dizziness, tremors and weakness.  Endo/Heme/Allergies: Does not bruise/bleed easily.  Psychiatric/Behavioral: Negative for depression. The patient is not nervous/anxious and does not have insomnia.     Objective: BP 110/70   Ht 5\' 7"  (1.702 m)   Wt 161 lb (73 kg)   LMP 02/08/2020 (Exact Date)   BMI 25.22 kg/m  Physical Exam Constitutional:      General: She is not in acute distress.    Appearance: She is well-developed and well-nourished.  Genitourinary:     Bladder, vagina, uterus, rectum and urethral meatus normal.     There is no rash or lesion on the right labia.     There is no rash or lesion on the left labia.    No lesions in the vagina.     No vaginal bleeding.      Right Adnexa: not tender and no mass present.    Left Adnexa: not tender and no mass present.    No cervical motion tenderness, friability, lesion or polyp.     Uterus is mobile.     Uterus is not enlarged.     No uterine mass detected.    Uterus exam comments: No mass  Retroverted uterus.     Uterus is midaxial and retroverted.     Pelvic exam was performed with patient supine.  Breasts:     Right: No mass, skin change or tenderness.     Left: No mass, skin change or tenderness.    HENT:     Head: Normocephalic and atraumatic. No laceration.     Right Ear: Hearing normal.     Left Ear: Hearing normal.     Nose: No epistaxis or foreign body.     Mouth/Throat:     Mouth: Oropharynx is clear and moist and mucous membranes are normal.     Pharynx: Uvula midline.  Eyes:     Pupils: Pupils are equal, round, and reactive to light.  Neck:      Thyroid: No thyromegaly.  Cardiovascular:     Rate and Rhythm: Normal rate and regular rhythm.     Heart sounds: No murmur heard. No friction rub. No gallop.   Pulmonary:     Effort: Pulmonary effort is normal. No respiratory distress.     Breath sounds: Normal breath sounds. No wheezing.  Abdominal:     General: Bowel sounds are normal. There is no distension.     Palpations: Abdomen is soft.     Tenderness: There is no abdominal tenderness. There is no rebound.  Musculoskeletal:        General: Normal range of motion.     Cervical back: Normal range of motion and neck supple.  Neurological:     Mental Status: She is alert and oriented to person, place, and time.     Cranial Nerves: No cranial nerve deficit.  Skin:    General: Skin is warm and dry.  Psychiatric:        Mood and Affect: Mood and affect normal.        Judgment: Judgment normal.  Vitals reviewed.     Female chaperone present for pelvic portion of the physical exam  Assessment: 40 y.o. G3P0030 with RLQ pain and abnormal recetn cycle of bleeding.  1. UTI symptoms - POCT Urinalysis Dipstick - Neg for UTI  2. Screening for cervical cancer - Cytology - PAP  3. RLQ abdominal pain - Cyst, fibroid, dysmenorrhea, other etiology discussed - US PELVIC COMPLETE WITH TRANSVAGINAL; Future  4. Menorrhagia with regular cycle  5. Pt uncertain about how aggressive she would want to approach fertility.  Feels she can and will not have children, unsure if would want REI referral.    Annamarie Major, MD, [facility], [facility] 03/09/2020  9:03 AM

## 2020-03-09 NOTE — Patient Instructions (Signed)
Thank you for choosing [facility]. As part of our ongoing efforts to improve patient experience, we would appreciate your feedback. Please fill out the short survey that you will receive by mail or MyChart. Your opinion is important to us! -Dr Kandi Brusseau   Transvaginal Ultrasound A transvaginal ultrasound, also called an endovaginal ultrasound, is a test that uses sound waves to take pictures of the female genital tract. The pictures are taken with a device, called a transducer, that is placed in the vagina. This test may be done to:  Check for problems with your pregnancy.  Check your developing baby.  Check for anything abnormal in the uterus or ovaries.  Find out why you have pelvic pain or bleeding. Tell a health care provider about:  Any allergies you have.  All medicines you are taking, including vitamins, herbs, eye drops, creams, and over-the-counter medicines.  Any blood disorders you have.  Any surgeries you have had.  Any medical conditions you have.  Whether you are pregnant or may be pregnant.  Whether you are having your menstrual period. What are the risks? This is a safe procedure. There are no known risks or complications of having this test. What happens before the procedure? This procedure needs to be done when your bladder is empty. Follow your health care provider's instructions about drinking fluids and emptying your bladder before the test. What happens during the procedure?  You will empty your bladder before the procedure.  You will undress from the waist down.  You will lie down on an exam table, with your knees bent and your feet in foot holders.  A health care provider will cover the transducer with a sterile cover.  A gel will be put on the transducer. The gel helps transmit the sound waves and prevents irritation of your vagina.  The technician will insert the transducer into your vagina to get images. These will be displayed on a monitor  that looks like a small television screen.  The transducer will be removed when the procedure is complete. The procedure may vary among health care providers and hospitals.   What happens after the procedure?  It is up to you to get the results of your procedure. Ask your health care provider, or the department that is doing the procedure, when your results will be ready.  Keep all follow-up visits as told by your health care provider. This is important. Summary  A transvaginal ultrasound, also called an endovaginal ultrasound, is a test that uses sound waves to take pictures of the female genital tract.  This is a safe procedure. There are no known risks associated with this test.  The procedure needs to be done when your bladder is empty. Follow your health care provider's instructions about drinking fluids and emptying your bladder before the test.  During the procedure, you will undress from the waist down and lie down on an exam table. A technician will insert a transducer into your vagina to obtain images.  Ask your health care provider, or the department that is doing the procedure, when your results will be ready. This information is not intended to replace advice given to you by your health care provider. Make sure you discuss any questions you have with your health care provider. Document Revised: 09/25/2017 Document Reviewed: 09/25/2017 Elsevier Patient Education  2021 [employer]  

## 2020-03-11 LAB — CYTOLOGY - PAP
Chlamydia: NEGATIVE
Comment: NEGATIVE
Comment: NEGATIVE
Comment: NEGATIVE
Comment: NORMAL
Diagnosis: NEGATIVE
Diagnosis: REACTIVE
High risk HPV: NEGATIVE
Neisseria Gonorrhea: NEGATIVE
Trichomonas: NEGATIVE

## 2020-03-16 ENCOUNTER — Ambulatory Visit (INDEPENDENT_AMBULATORY_CARE_PROVIDER_SITE_OTHER): Payer: 59

## 2020-03-16 ENCOUNTER — Other Ambulatory Visit: Payer: Self-pay

## 2020-03-16 ENCOUNTER — Encounter: Payer: Self-pay | Admitting: Obstetrics & Gynecology

## 2020-03-16 ENCOUNTER — Ambulatory Visit (INDEPENDENT_AMBULATORY_CARE_PROVIDER_SITE_OTHER): Payer: 59 | Admitting: Obstetrics & Gynecology

## 2020-03-16 VITALS — BP 110/70 | Ht 67.0 in | Wt 159.0 lb

## 2020-03-16 DIAGNOSIS — R1031 Right lower quadrant pain: Secondary | ICD-10-CM

## 2020-03-16 DIAGNOSIS — N920 Excessive and frequent menstruation with regular cycle: Secondary | ICD-10-CM

## 2020-03-16 NOTE — Progress Notes (Signed)
  HPI: Pt returns, had prolonged cycle last month and now has delayed onset to cycle.  Still has pain.  Also dyspareunia.   Her pain is localized to the RLQ area, described as intermittent, sharp and stabbing, began 2 mos ago and its severity is described as moderate. The pain radiates to the  Non-radiating. She has these associated symptoms which include heavy prolonged cycle for 3 weeks this last cycle (not normal for her) w dyspareunia too, and recent post coital bleeding.  Usually has reg cycles and 4-5 day cycles.  Ultrasound demonstrates no masses seen, no cysts These findings are Pelvis normal  PMHx: She  has a past medical history of Depression and Kidney stones. Also,  has no past surgical history on file., family history includes Diabetes in her mother.,  reports that she has been smoking cigarettes. She has been smoking about 1.50 packs per day. She has never used smokeless tobacco. She reports current drug use. Frequency: 3.00 times per week. Drug: Marijuana. She reports that she does not drink alcohol.  She currently has no medications in their medication list. Also, is allergic to lexapro [escitalopram oxalate].  Review of Systems  All other systems reviewed and are negative.   Objective: BP 110/70   Ht 5\' 7"  (1.702 m)   Wt 159 lb (72.1 kg)   LMP 02/08/2020   BMI 24.90 kg/m   Physical examination Constitutional NAD, Conversant  Skin No rashes, lesions or ulceration.   Extremities: Moves all appropriately.  Normal ROM for age. No lymphadenopathy.  Neuro: Grossly intact  Psych: Oriented to PPT.  Normal mood. Normal affect.   02/10/2020 PELVIC COMPLETE WITH TRANSVAGINAL  Result Date: 03/16/2020 Patient Name: Margaret Gonzales DOB: 1981/02/03 MRN: [id number] ULTRASOUND REPORT Location: [facility] Date of Service: 03/16/2020 Indications:Pelvic Pain Findings: The uterus is retroverted and measures 7.8 x 4.7 x 3.9 cm. Echo texture is homogenous without evidence of focal masses. The  Endometrium measures 6.4 mm. Right Ovary measures 4.9 x 2.5 x 2.7 cm. It is normal in appearance. Left Ovary measures 2.4 x 2.4 x 2.3 cm. It is normal in appearance. Survey of the adnexa demonstrates no adnexal masses. There is no free fluid in the cul de sac. Impression: 1. Normal pelvic ultrasound. Recommendations: 1.Clinical correlation with the patient's History and Physical Exam. 03/18/2020, RT Review of ULTRASOUND.    I have personally reviewed images and report of recent ultrasound done at [facility].    Plan of management to be discussed with patient. [facility], MD, FACOG [facility], [facility] 03/16/2020  3:29 PM   Assessment:  RLQ abdominal pain Menorrhagia with regular cycle  Plan: Monitor cycles and pain (diary fashion) for 2 mos then re-assess. Discussed Dx Lap as next Gyn option.  Also can consider referral for alternative work up.  A total of 20 minutes were spent face-to-face with the patient as well as preparation, review, communication, and documentation during this encounter.   03/18/2020, MD, [facility], [facility] 03/16/2020  3:39 PM

## 2020-08-03 ENCOUNTER — Emergency Department: Payer: 59

## 2020-08-03 ENCOUNTER — Emergency Department
Admission: EM | Admit: 2020-08-03 | Discharge: 2020-08-03 | Disposition: A | Discharge status: Home / Self Care | Payer: 59 | Attending: Emergency Medicine | Admitting: Emergency Medicine

## 2020-08-03 ENCOUNTER — Other Ambulatory Visit: Payer: Self-pay

## 2020-08-03 DIAGNOSIS — S90452A Superficial foreign body, left great toe, initial encounter: Secondary | ICD-10-CM | POA: Diagnosis not present

## 2020-08-03 DIAGNOSIS — M7732 Calcaneal spur, left foot: Secondary | ICD-10-CM | POA: Diagnosis not present

## 2020-08-03 DIAGNOSIS — S90852A Superficial foreign body, left foot, initial encounter: Secondary | ICD-10-CM | POA: Insufficient documentation

## 2020-08-03 DIAGNOSIS — F17210 Nicotine dependence, cigarettes, uncomplicated: Secondary | ICD-10-CM | POA: Insufficient documentation

## 2020-08-03 DIAGNOSIS — R69 Illness, unspecified: Secondary | ICD-10-CM | POA: Diagnosis not present

## 2020-08-03 DIAGNOSIS — W458XXA Other foreign body or object entering through skin, initial encounter: Secondary | ICD-10-CM | POA: Diagnosis not present

## 2020-08-03 DIAGNOSIS — S90859A Superficial foreign body, unspecified foot, initial encounter: Secondary | ICD-10-CM

## 2020-08-03 MED ORDER — FENTANYL CITRATE (PF) 100 MCG/2ML IJ SOLN
100.0000 ug | Freq: Once | INTRAMUSCULAR | Status: AC
Start: 2020-08-03 — End: 2020-08-03
  Administered 2020-08-03: 100 ug via INTRAMUSCULAR
  Filled 2020-08-03: qty 2

## 2020-08-03 MED ORDER — HYDROCODONE-ACETAMINOPHEN 5-325 MG PO TABS: 1.0000 | ORAL_TABLET | Freq: Four times a day (QID) | ORAL | 0 refills | Status: AC | PRN

## 2020-08-03 MED ORDER — CIPROFLOXACIN HCL 500 MG PO TABS: 500.0000 mg | ORAL_TABLET | Freq: Two times a day (BID) | ORAL | 0 refills | Status: AC

## 2020-08-03 NOTE — ED Provider Notes (Signed)
[facility] Emergency Department Provider Note  ____________________________________________  Time seen: Approximately 4:30 PM  I have reviewed the triage vital signs and the nursing notes.   HISTORY  Chief Complaint Foot Injury   HPI Margaret Gonzales is a 40 y.o. female who presents to the emergency department for treatment and evaluation after a wooden splinter from the flooring went through her shoe and into her foot.  No alleviating measures attempted prior to arrival. Tdap booster is current.  Past Medical History:  Diagnosis Date   Depression    Kidney stones     Patient Active Problem List   Diagnosis Date Noted   Depression 03/29/2015   Anxiety disorder 03/29/2015   Migraine 02/15/2015   Encounter to establish care 02/15/2015   Smoker 02/15/2015   Neck pain on right side 02/15/2015    History reviewed. No pertinent surgical history.  Prior to Admission medications   Medication Sig Start Date End Date Taking? Authorizing Provider  ciprofloxacin (CIPRO) 500 MG tablet Take 1 tablet (500 mg total) by mouth 2 (two) times daily for 7 days. 08/03/20 08/10/20 Yes Antoninette Lerner B, FNP  HYDROcodone-acetaminophen (NORCO/VICODIN) 5-325 MG tablet Take 1 tablet by mouth every 6 (six) hours as needed for up to 3 days for severe pain. 08/03/20 08/06/20 Yes Andrena Margerum B, FNP    Allergies Lexapro [escitalopram oxalate]  Family History  Problem Relation Age of Onset   Diabetes Mother     Social History Social History   Tobacco Use   Smoking status: Every Day    Packs/day: 1.50    Pack years: 0.00    Types: Cigarettes   Smokeless tobacco: Never  Vaping Use   Vaping Use: Never used  Substance Use Topics   Alcohol use: No   Drug use: Yes    Frequency: 3.0 times per week    Types: Marijuana    Review of Systems  Constitutional: Negative for fever. Respiratory: Negative for cough or shortness of breath.  Musculoskeletal: Negative for  myalgias Skin: Positive for retained foreign body  Neurological: Negative for numbness or paresthesias. ____________________________________________   PHYSICAL EXAM:  VITAL SIGNS: ED Triage Vitals  Enc Vitals Group     BP 08/03/20 1403 (!) 110/52     Pulse Rate 08/03/20 1403 84     Resp 08/03/20 1403 18     Temp 08/03/20 1403 98.2 F (36.8 C)     Temp Source 08/03/20 1403 Oral     SpO2 08/03/20 1403 98 %     Weight 08/03/20 1403 165 lb (74.8 kg)     Height 08/03/20 1403 5\' 7"  (1.702 m)     Head Circumference --      Peak Flow --      Pain Score 08/03/20 1402 9     Pain Loc --      Pain Edu? --      Excl. in GC? --      Constitutional: Well appearing. Eyes: Conjunctivae are clear without discharge or drainage. Nose: No rhinorrhea noted. Mouth/Throat: Airway is patent.  Neck: No stridor. Unrestricted range of motion observed. Cardiovascular: Capillary refill is <3 seconds.  Respiratory: Respirations are even and unlabored.. Musculoskeletal: Unrestricted range of motion observed. Neurologic: Awake, alert, and oriented x 4.  Skin:  Wooden splinter protruding about 4cm from plantar aspect of left foot.  ____________________________________________   LABS (all labs ordered are listed, but only abnormal results are displayed)  Labs Reviewed - No data to display  ____________________________________________  EKG  Not indicated. ____________________________________________  RADIOLOGY  Retained FB left foot. ____________________________________________   PROCEDURES  .Foreign Body Removal  Date/Time: 08/04/2020 4:51 PM Performed by: Chinita Pester, FNP Authorized by: Chinita Pester, FNP  Consent: Verbal consent obtained. Consent given by: patient Imaging studies: imaging studies available Body area: skin General location: lower extremity Location details: left foot  Sedation: Patient sedated: no  Patient cooperative: yes Localization method:  visualized Removal mechanism: forceps Dressing: dressing applied Depth: subcutaneous Complexity: simple Post-procedure assessment: foreign body removed Patient tolerance: patient tolerated the procedure well with no immediate complications  ____________________________________________   INITIAL IMPRESSION / ASSESSMENT AND PLAN / ED COURSE  Margaret Gonzales is a 40 y.o. female presents to the emergency department for treatment and evaluation of retained wooden splinter.   Medications  fentaNYL (SUBLIMAZE) injection 100 mcg (100 mcg Intramuscular Given 08/03/20 1634)     Pertinent labs & imaging results that were available during my care of the patient were reviewed by me and considered in my medical decision making (see chart for details).  ____________________________________________   FINAL CLINICAL IMPRESSION(S) / ED DIAGNOSES  Final diagnoses:  Splinter of foot without infection, initial encounter    ED Discharge Orders          Ordered    ciprofloxacin (CIPRO) 500 MG tablet  2 times daily        08/03/20 1723    HYDROcodone-acetaminophen (NORCO/VICODIN) 5-325 MG tablet  Every 6 hours PRN        08/03/20 1723             Note:  This document was prepared using Dragon voice recognition software and may include unintentional dictation errors.   Chinita Pester, FNP 08/04/20 1653    Chesley Noon, MD 08/04/20 [phone]

## 2020-08-03 NOTE — Discharge Instructions (Signed)
Please follow up with primary care or return to the ER for symptoms of concern. 

## 2020-08-03 NOTE — ED Triage Notes (Signed)
Pt arrives to ER with wood in bottom of L foot from flooring. A&O, in wheelchair. No bleeding at this time. Tetanus shot in 2019 per pt.

## 2020-08-04 ENCOUNTER — Telehealth: Payer: Self-pay | Admitting: Family Medicine

## 2020-08-04 MED ORDER — FLUCONAZOLE 150 MG PO TABS: 150.0000 mg | ORAL_TABLET | Freq: Every day | ORAL | 0 refills | Status: AC

## 2020-08-04 NOTE — Telephone Encounter (Cosign Needed)
Patient called to request Diflucan. She states that antibiotics typically give her a yeast infection.  Med sent to pharmacy.

## 2022-06-13 DIAGNOSIS — H6990 Unspecified Eustachian tube disorder, unspecified ear: Secondary | ICD-10-CM | POA: Diagnosis not present

## 2022-06-13 DIAGNOSIS — J019 Acute sinusitis, unspecified: Secondary | ICD-10-CM | POA: Diagnosis not present

## 2022-06-13 DIAGNOSIS — B9689 Other specified bacterial agents as the cause of diseases classified elsewhere: Secondary | ICD-10-CM | POA: Diagnosis not present

## 2022-07-17 DIAGNOSIS — J340 Abscess, furuncle and carbuncle of nose: Secondary | ICD-10-CM | POA: Diagnosis not present

## 2022-07-17 DIAGNOSIS — H93292 Other abnormal auditory perceptions, left ear: Secondary | ICD-10-CM | POA: Diagnosis not present

## 2022-07-17 DIAGNOSIS — H60332 Swimmer's ear, left ear: Secondary | ICD-10-CM | POA: Diagnosis not present

## 2022-08-08 DIAGNOSIS — J340 Abscess, furuncle and carbuncle of nose: Secondary | ICD-10-CM | POA: Diagnosis not present

## 2022-08-08 DIAGNOSIS — H60332 Swimmer's ear, left ear: Secondary | ICD-10-CM | POA: Diagnosis not present

## 2022-10-08 IMAGING — CR DG FOOT 2V*L*
2 series · 2 of 2 positions shown · non-contrast
Comparison: None.

CLINICAL DATA: Foreign body in great toe

EXAM:
LEFT FOOT - 2 VIEW

[foot ap]
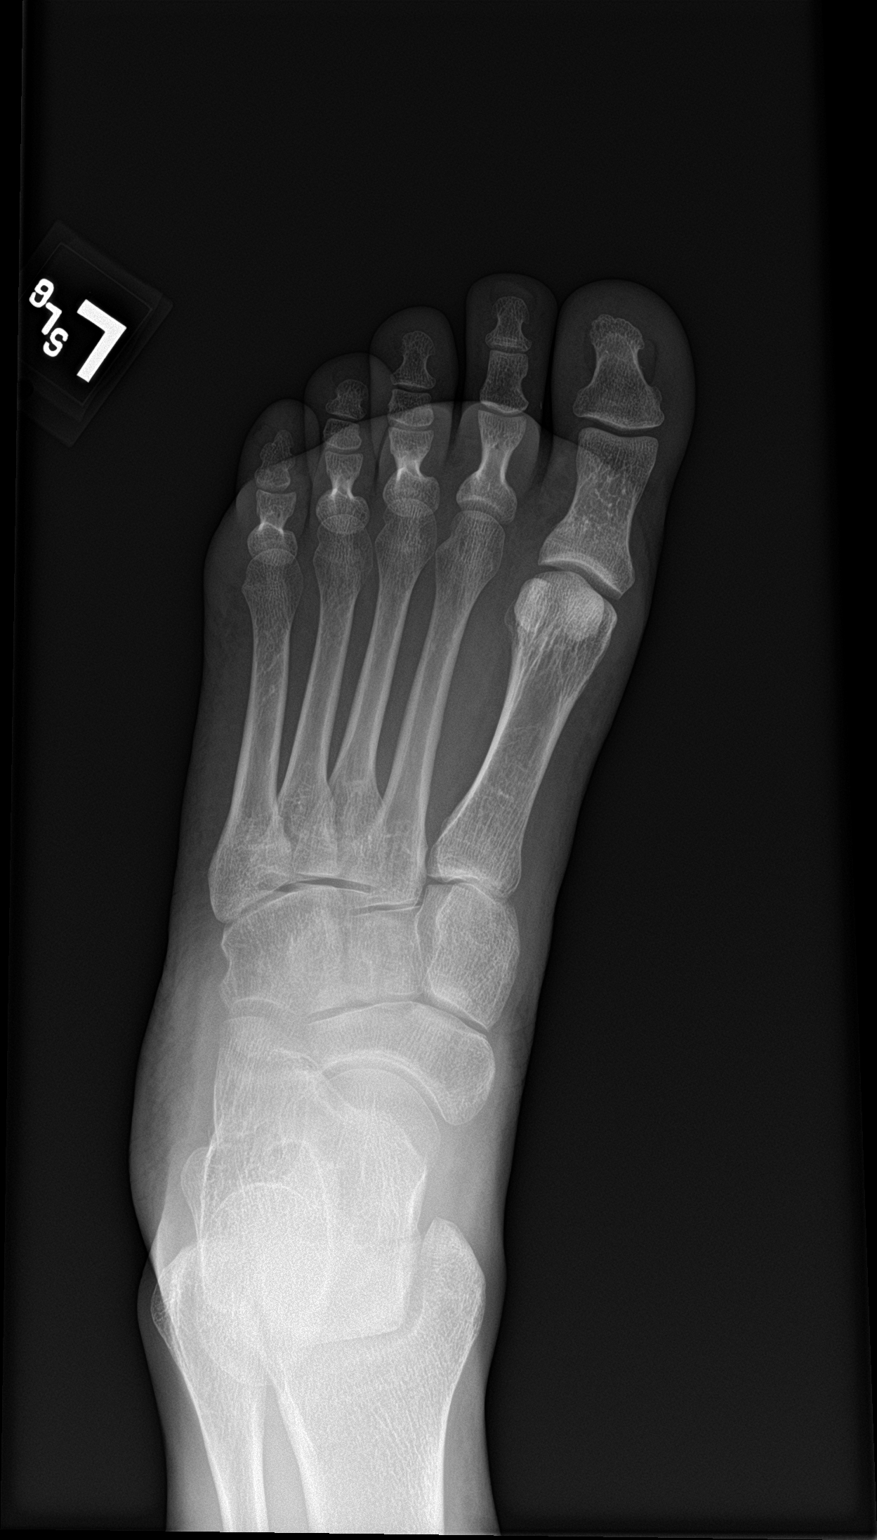

[foot lat]
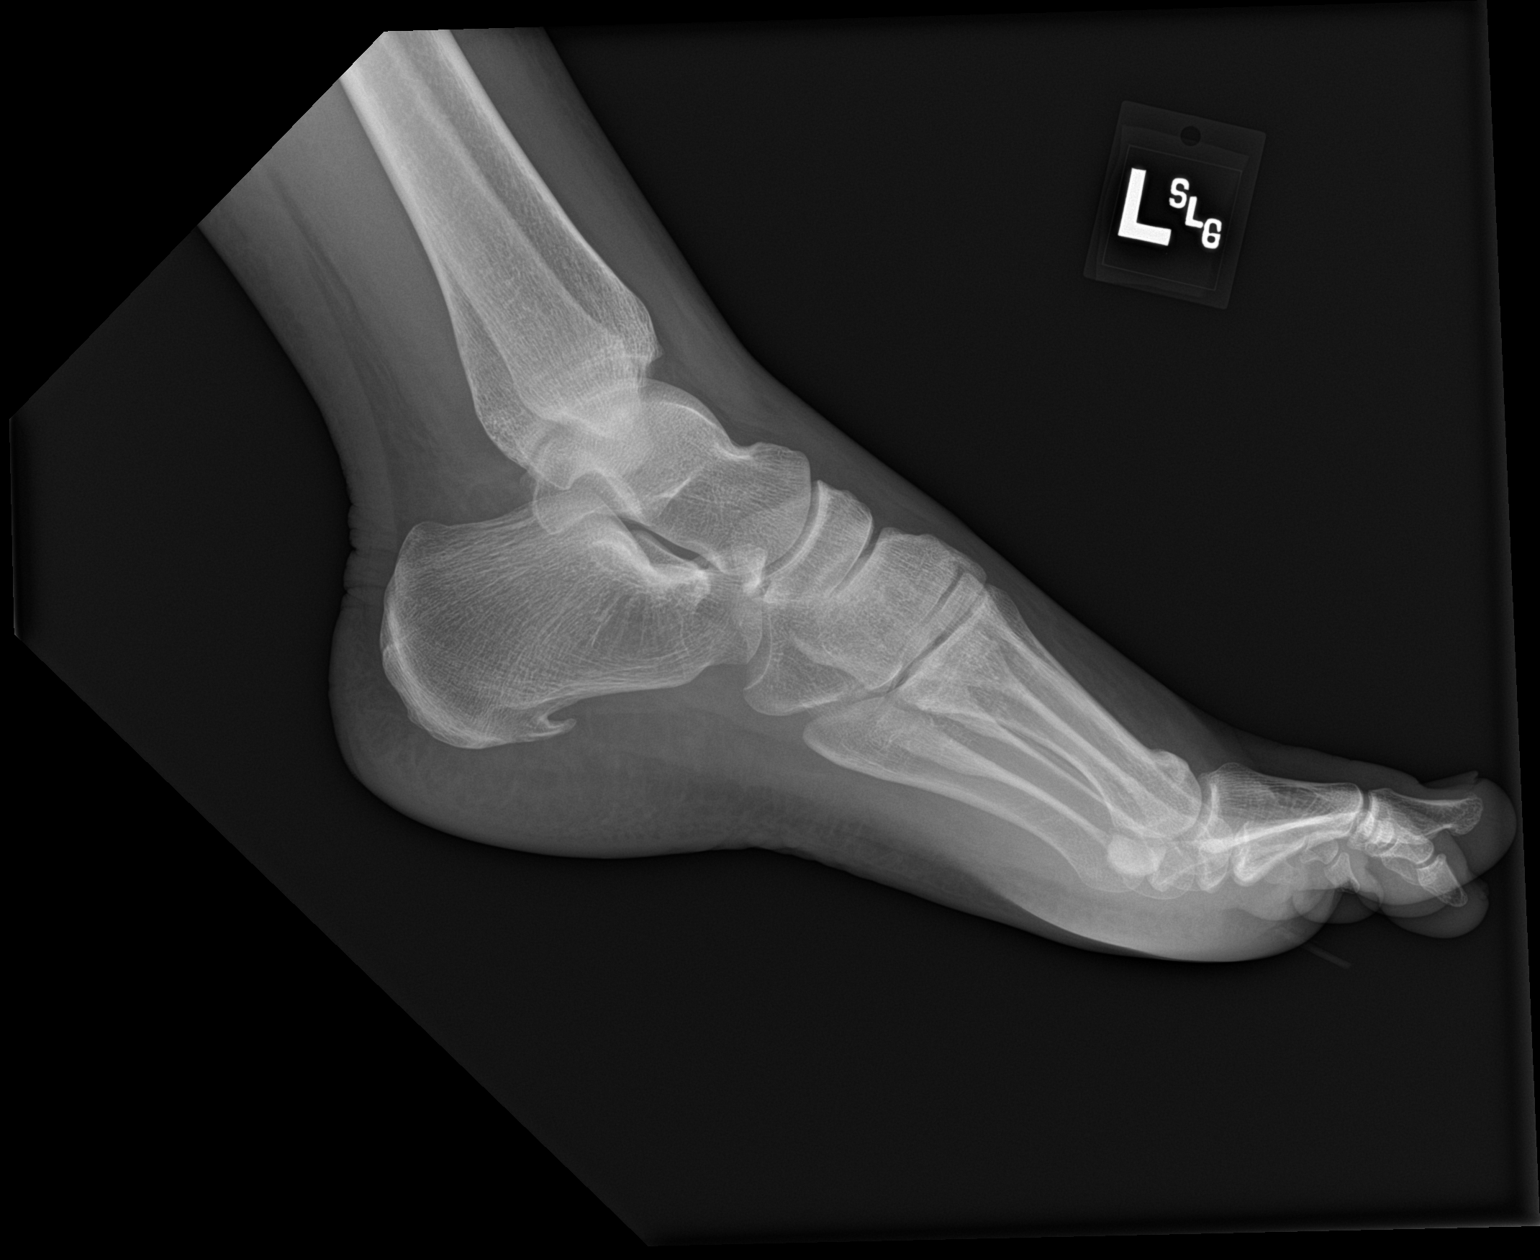

[2 of 2 positions shown; findings below may reference images not displayed]

FINDINGS: Linear density is seen projecting over the plantar soft tissues on
the lateral view, possibly a foreign body partially imbedded in the
plantar soft tissues near the base of the toes. No acute bony
abnormality. Specifically, no fracture, subluxation, or dislocation.
Plantar calcaneal spur.
IMPRESSION: Possible linear foreign body within the plantar soft tissues near
the base of the toes seen on the lateral view.

## 2023-04-12 ENCOUNTER — Other Ambulatory Visit: Payer: Self-pay | Admitting: Physician Assistant

## 2023-04-12 DIAGNOSIS — R2242 Localized swelling, mass and lump, left lower limb: Secondary | ICD-10-CM

## 2023-04-15 ENCOUNTER — Ambulatory Visit
Admission: RE | Admit: 2023-04-15 | Discharge: 2023-04-15 | Disposition: A | Discharge status: Home / Self Care | Payer: 59 | Source: Ambulatory Visit | Attending: Physician Assistant | Admitting: Physician Assistant

## 2023-04-15 DIAGNOSIS — R2242 Localized swelling, mass and lump, left lower limb: Secondary | ICD-10-CM | POA: Insufficient documentation
